# Patient Record
Sex: Female | Born: 1995 | Hispanic: Yes | Marital: Single | Smoking: Former smoker
Health system: Southern US, Community
[De-identification: ages and names within clinical notes are randomized; demographics above are authoritative.]

## PROBLEM LIST (undated history)

## (undated) ENCOUNTER — Inpatient Hospital Stay (HOSPITAL_COMMUNITY): Payer: Self-pay

## (undated) DIAGNOSIS — Z789 Other specified health status: Secondary | ICD-10-CM

## (undated) HISTORY — PX: NO PAST SURGERIES: SHX2092

---
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
## 2013-12-26 NOTE — L&D Delivery Note (Signed)
Delivery Note At 1:29 AM a viable female was delivered via  (Presentation: LOA). Body cord x 1.    Placenta status: delivered with assistance via Tomasa BlaseSchultz; to Pathology.  Cord pH: sent  Anesthesia: None  Episiotomy: None Lacerations: None Suture Repair: n/a Est. Blood Loss (mL): 100 ml  Mom to postpartum.  Baby to NICU.  JACKSON-MOORE,Anistyn Graddy A 10/07/2014, 1:41 AM

## 2014-01-30 ENCOUNTER — Encounter (HOSPITAL_COMMUNITY): Payer: Self-pay | Admitting: Emergency Medicine

## 2014-01-30 DIAGNOSIS — R1013 Epigastric pain: Secondary | ICD-10-CM | POA: Insufficient documentation

## 2014-01-30 DIAGNOSIS — E860 Dehydration: Secondary | ICD-10-CM | POA: Insufficient documentation

## 2014-01-30 DIAGNOSIS — Z3202 Encounter for pregnancy test, result negative: Secondary | ICD-10-CM | POA: Insufficient documentation

## 2014-01-30 DIAGNOSIS — R112 Nausea with vomiting, unspecified: Secondary | ICD-10-CM | POA: Insufficient documentation

## 2014-01-30 DIAGNOSIS — Z87891 Personal history of nicotine dependence: Secondary | ICD-10-CM | POA: Insufficient documentation

## 2014-01-30 LAB — URINALYSIS, ROUTINE W REFLEX MICROSCOPIC
GLUCOSE, UA: NEGATIVE mg/dL
Hgb urine dipstick: NEGATIVE
LEUKOCYTES UA: NEGATIVE
NITRITE: NEGATIVE
PH: 5.5 (ref 5.0–8.0)
PROTEIN: 30 mg/dL — AB
Specific Gravity, Urine: 1.035 — ABNORMAL HIGH (ref 1.005–1.030)
Urobilinogen, UA: 1 mg/dL (ref 0.0–1.0)

## 2014-01-30 LAB — CBC WITH DIFFERENTIAL/PLATELET
BASOS PCT: 0 % (ref 0–1)
Basophils Absolute: 0 10*3/uL (ref 0.0–0.1)
EOS ABS: 0 10*3/uL (ref 0.0–0.7)
EOS PCT: 0 % (ref 0–5)
HEMATOCRIT: 40.1 % (ref 36.0–46.0)
HEMOGLOBIN: 14.3 g/dL (ref 12.0–15.0)
LYMPHS ABS: 0.9 10*3/uL (ref 0.7–4.0)
Lymphocytes Relative: 8 % — ABNORMAL LOW (ref 12–46)
MCH: 33.1 pg (ref 26.0–34.0)
MCHC: 35.7 g/dL (ref 30.0–36.0)
MCV: 92.8 fL (ref 78.0–100.0)
MONO ABS: 0.8 10*3/uL (ref 0.1–1.0)
MONOS PCT: 8 % (ref 3–12)
Neutro Abs: 9 10*3/uL — ABNORMAL HIGH (ref 1.7–7.7)
Neutrophils Relative %: 84 % — ABNORMAL HIGH (ref 43–77)
Platelets: 329 10*3/uL (ref 150–400)
RBC: 4.32 MIL/uL (ref 3.87–5.11)
RDW: 12 % (ref 11.5–15.5)
WBC: 10.7 10*3/uL — ABNORMAL HIGH (ref 4.0–10.5)

## 2014-01-30 LAB — URINE MICROSCOPIC-ADD ON

## 2014-01-30 LAB — POCT PREGNANCY, URINE: PREG TEST UR: NEGATIVE

## 2014-01-30 MED ORDER — ONDANSETRON 4 MG PO TBDP
8.0000 mg | ORAL_TABLET | Freq: Once | ORAL | Status: AC
  Administered 2014-01-30: 8 mg via ORAL
  Filled 2014-01-30: qty 2

## 2014-01-30 NOTE — ED Notes (Signed)
Pt reports nausea and vomiting since last night.  Pt states last vomited 30 minutes prior

## 2014-01-31 ENCOUNTER — Emergency Department (HOSPITAL_COMMUNITY)
Admission: EM | Admit: 2014-01-31 | Discharge: 2014-01-31 | Disposition: A | Discharge status: Home / Self Care | Payer: Medicaid Other | Attending: Emergency Medicine | Admitting: Emergency Medicine

## 2014-01-31 DIAGNOSIS — R112 Nausea with vomiting, unspecified: Secondary | ICD-10-CM

## 2014-01-31 DIAGNOSIS — E860 Dehydration: Secondary | ICD-10-CM

## 2014-01-31 LAB — COMPREHENSIVE METABOLIC PANEL
ALBUMIN: 4.4 g/dL (ref 3.5–5.2)
ALT: 10 U/L (ref 0–35)
AST: 16 U/L (ref 0–37)
Alkaline Phosphatase: 88 U/L (ref 39–117)
BUN: 14 mg/dL (ref 6–23)
CO2: 19 mEq/L (ref 19–32)
CREATININE: 0.7 mg/dL (ref 0.50–1.10)
Calcium: 9.3 mg/dL (ref 8.4–10.5)
Chloride: 101 mEq/L (ref 96–112)
GFR calc non Af Amer: >90 mL/min (ref >90)
GLUCOSE: 113 mg/dL — AB (ref 70–99)
Potassium: 4 mEq/L (ref 3.7–5.3)
Sodium: 138 mEq/L (ref 137–147)
TOTAL PROTEIN: 7.6 g/dL (ref 6.0–8.3)
Total Bilirubin: 0.9 mg/dL (ref 0.3–1.2)

## 2014-01-31 MED ORDER — SODIUM CHLORIDE 0.9 % IV SOLN
1000.0000 mL | Freq: Once | INTRAVENOUS | Status: AC
  Administered 2014-01-31: 1000 mL via INTRAVENOUS

## 2014-01-31 MED ORDER — PANTOPRAZOLE SODIUM 40 MG IV SOLR
40.0000 mg | Freq: Once | INTRAVENOUS | Status: AC
  Administered 2014-01-31: 40 mg via INTRAVENOUS
  Filled 2014-01-31: qty 40

## 2014-01-31 MED ORDER — SODIUM CHLORIDE 0.9 % IV SOLN: 1000.0000 mL | INTRAVENOUS | Status: DC

## 2014-01-31 MED ORDER — ONDANSETRON 8 MG PO TBDP: 8.0000 mg | ORAL_TABLET | Freq: Three times a day (TID) | ORAL | Status: DC | PRN

## 2014-01-31 NOTE — ED Notes (Signed)
Ate crackers, denies nausea.  Ready to go home.

## 2014-01-31 NOTE — ED Notes (Signed)
1.5 liters NS infusion completed.  Pt given crackers and ginger ale.

## 2014-01-31 NOTE — Discharge Instructions (Signed)
Nausea and Vomiting Nausea is a sick feeling that often comes before throwing up (vomiting). Vomiting is a reflex where stomach contents come out of your mouth. Vomiting can cause severe loss of body fluids (dehydration). Children and elderly adults can become dehydrated quickly, especially if they also have diarrhea. Nausea and vomiting are symptoms of a condition or disease. It is important to find the cause of your symptoms. CAUSES   Direct irritation of the stomach lining. This irritation can result from increased acid production (gastroesophageal reflux disease), infection, food poisoning, taking certain medicines (such as nonsteroidal anti-inflammatory drugs), alcohol use, or tobacco use.  Signals from the brain.These signals could be caused by a headache, heat exposure, an inner ear disturbance, increased pressure in the brain from injury, infection, a tumor, or a concussion, pain, emotional stimulus, or metabolic problems.  An obstruction in the gastrointestinal tract (bowel obstruction).  Illnesses such as diabetes, hepatitis, gallbladder problems, appendicitis, kidney problems, cancer, sepsis, atypical symptoms of a heart attack, or eating disorders.  Medical treatments such as chemotherapy and radiation.  Receiving medicine that makes you sleep (general anesthetic) during surgery. DIAGNOSIS Your caregiver may ask for tests to be done if the problems do not improve after a few days. Tests may also be done if symptoms are severe or if the reason for the nausea and vomiting is not clear. Tests may include:  Urine tests.  Blood tests.  Stool tests.  Cultures (to look for evidence of infection).  X-rays or other imaging studies. Test results can help your caregiver make decisions about treatment or the need for additional tests. TREATMENT You need to stay well hydrated. Drink frequently but in small amounts.You may wish to drink water, sports drinks, clear broth, or eat frozen  ice pops or gelatin dessert to help stay hydrated.When you eat, eating slowly may help prevent nausea.There are also some antinausea medicines that may help prevent nausea. HOME CARE INSTRUCTIONS   Take all medicine as directed by your caregiver.  If you do not have an appetite, do not force yourself to eat. However, you must continue to drink fluids.  If you have an appetite, eat a normal diet unless your caregiver tells you differently.  Eat a variety of complex carbohydrates (rice, wheat, potatoes, bread), lean meats, yogurt, fruits, and vegetables.  Avoid high-fat foods because they are more difficult to digest.  Drink enough water and fluids to keep your urine clear or pale yellow.  If you are dehydrated, ask your caregiver for specific rehydration instructions. Signs of dehydration may include:  Severe thirst.  Dry lips and mouth.  Dizziness.  Dark urine.  Decreasing urine frequency and amount.  Confusion.  Rapid breathing or pulse. SEEK IMMEDIATE MEDICAL CARE IF:   You have blood or brown flecks (like coffee grounds) in your vomit.  You have black or bloody stools.  You have a severe headache or stiff neck.  You are confused.  You have severe abdominal pain.  You have chest pain or trouble breathing.  You do not urinate at least once every 8 hours.  You develop cold or clammy skin.  You continue to vomit for longer than 24 to 48 hours.  You have a fever. MAKE SURE YOU:   Understand these instructions.  Will watch your condition.  Will get help right away if you are not doing well or get worse. Document Released: 12/12/2005 Document Revised: 03/05/2012 Document Reviewed: 05/11/2011 [facility] Patient Information 2014 [place], [place].  If you were a  [facility] patient or do not have insurance, here are some clinics in our community that may be able to provide care for free or on sliding payment scales.  [facility], [address], [place], [phone]; Monday to Friday, 9 a.m. - 7 p.m.; Saturday 9 a.m. to 1 p.m.   [facility], [address] [place]; [phone]; or [address]  [place]; [phone].    [facility] [place], [place]. [address] [place]; [phone]; Monday to Wednesday, 8:30 a.m. - 5 p.m.; Thursday, 8:30 a.m. - 8 p.m.   [facility], [address], [place]; [phone]; Monday to Friday, 8 a.m. - 4:30 p.m.    [facility], [place] [address] [place], [phone]; first and third Saturday of the  month, 9:30 a.m. - 12:30 p.m.   [facility], [address] [place], [phone]; second Saturday of the month, 9 a.m. -noon.   RESOURCE GUIDE  Dental Problems  Patients with Medicaid: [facility] [phone] [address]                                                                   [phone] [address] [employer] Phone:  [phone]                                                                             Phone:  [phone]  If unable to pay or uninsured, contact:  [facility] or [facility]. to become qualified for the adult dental clinic.  Chronic Pain Problems Contact [facility]  [phone] Patients need to be referred by their primary care doctor.  Insufficient Money for Medicine Contact [employer]:  call "211" or [facility] [phone].  No Primary Care Doctor Call [facility]  [phone] Other agencies that provide inexpensive medical care    [facility]  [phone]    [facility]  [phone]    [facility]  [phone]    [facility]  [phone]    [facility]  [phone]  Psychological Services [facility]  [phone] [facility]  [phone] [facility]   [phone] (emergency services  [phone])  Abuse/Neglect [facility] Child Abuse Hotline (531)712-8728 [facility] Child Abuse Hotline 726-640-1037 (After Hours)  Emergency Shelter [facility] (769)619-7620  [facility] [place] [facility] [place] 807-688-4270 [facility] [phone]  MRSA Hotline #:   (262) 350-0831  [facility]  [facility] [address] [place]                        [address]          [address]  [place]                                                [place]                            [place] Phone:  [phone]                                     Phone:  [phone]                   Phone:  [phone]  [facility] Phone:  [phone]  [facility] Child Abuse Hotline (570) 290-0478(336) 252-459-1383 [phone]) [phone](336) [phone] (After Hours)   Community Resources: *IF YOU ARE IN IMMEDIATE DANGER CALL 911!  Abuse/Neglect:  Family Services Crisis Hotline [facility]([place]): [phone](336) [phone] [facility] Against [facility]([place]): [phone]  After hours, holidays and weekends: [phone] National Domestic Violence Hotline: 714-717-31778384076961  Mental Health: [facility]: [address]: [phone](336) [phone]  Health Clinics:  [facility]): [phone](336) [phone] Monday - Friday 8 AM - 9 PM, Saturday and Sunday 10 AM - 9 PM   [facility]  [address]: [phone] Monday- Friday 8:30 AM - 5:30 PM, Sat 9 AM - 1 PM  24 [facility] [place] [facility] [address]: [phone] [facility]: [phone] [facility] [address]: [phone]  24 HR [facility]: [address] [phone]

## 2014-01-31 NOTE — ED Provider Notes (Signed)
CSN: [id number]     Arrival date & time 01/30/14  2300 History   First MD Initiated Contact with Patient 01/31/14 0057     Chief Complaint  Patient presents with  . Emesis   (Consider location/radiation/quality/duration/timing/severity/associated sxs/prior Treatment) HPI 18 year old female presents to emergency department with complaint of nausea and vomiting.  Vomiting starting Wednesday night.  Her  daughter has the same illness.  No fevers no chills.  No diarrhea.  She has mild upper abdominal pain.  She's been unable to down food or liquids.  No other symptoms Past Medical History  Diagnosis Date  . No pertinent past medical history    Past Surgical History  Procedure Laterality Date  . No past surgeries     No family history on file. History  Substance Use Topics  . Smoking status: Former Smoker    Types: Cigarettes    Quit date: 02/01/2012  . Smokeless tobacco: Not on file  . Alcohol Use: No     Comment: on weekends   OB History   Grav Para Term Preterm Abortions TAB SAB Ect Mult Living   1 1 0 1 0 0 0 0 0 1      Review of Systems  See History of Present Illness; otherwise all other systems are reviewed and negative Allergies  Review of patient's allergies indicates no known allergies.  Home Medications  No current outpatient prescriptions on file. BP 126/68  Pulse 109  Temp(Src) 98.3 F (36.8 C) (Oral)  Resp 18  Wt 98 lb 4.8 oz (44.589 kg)  SpO2 97% Physical Exam  Nursing note and vitals reviewed. Constitutional: She is oriented to person, place, and time. She appears well-developed and well-nourished.  HENT:  Head: Normocephalic and atraumatic.  Right Ear: External ear normal.  Left Ear: External ear normal.  Nose: Nose normal.  Dry mucous membranes  Eyes: Conjunctivae and EOM are normal. Pupils are equal, round, and reactive to light.  Neck: Normal range of motion. Neck supple. No JVD present. No tracheal deviation present. No thyromegaly present.   Cardiovascular: Normal rate, regular rhythm, normal heart sounds and intact distal pulses.  Exam reveals no gallop and no friction rub.   No murmur heard. Pulmonary/Chest: Effort normal and breath sounds normal. No stridor. No respiratory distress. She has no wheezes. She has no rales. She exhibits no tenderness.  Abdominal: Soft. Bowel sounds are normal. She exhibits no distension and no mass. There is tenderness (mild to moderate epigastric tenderness with palpation.  No Murphy's sign, there no right upper quadrant pain). There is no rebound and no guarding.  Musculoskeletal: Normal range of motion. She exhibits no edema and no tenderness.  Lymphadenopathy:    She has no cervical adenopathy.  Neurological: She is alert and oriented to person, place, and time. She exhibits normal muscle tone. Coordination normal.  Skin: Skin is warm and dry. No rash noted. No erythema. No pallor.  Psychiatric: She has a normal mood and affect. Her behavior is normal. Judgment and thought content normal.    ED Course  Procedures (including critical care time) Labs Review Labs Reviewed  CBC WITH DIFFERENTIAL - Abnormal; Notable for the following:    WBC 10.7 (*)    Neutrophils Relative % 84 (*)    Neutro Abs 9.0 (*)    Lymphocytes Relative 8 (*)    All other components within normal limits  COMPREHENSIVE METABOLIC PANEL - Abnormal; Notable for the following:    Glucose, Bld 113 (*)  All other components within normal limits  URINALYSIS, ROUTINE W REFLEX MICROSCOPIC - Abnormal; Notable for the following:    APPearance CLOUDY (*)    Specific Gravity, Urine 1.035 (*)    Bilirubin Urine SMALL (*)    Ketones, ur >80 (*)    Protein, ur 30 (*)    All other components within normal limits  URINE MICROSCOPIC-ADD ON - Abnormal; Notable for the following:    Squamous Epithelial / LPF MANY (*)    Bacteria, UA FEW (*)    All other components within normal limits  POCT PREGNANCY, URINE   Imaging Review No  results found.  EKG Interpretation   None       MDM   1. Nausea and vomiting   2. Dehydration    18 year old female with nausea and vomiting.  Her labs show ketones in the urine, otherwise, unremarkable.  Plan to give IV fluids, Zofran, by mouth challenge and then home.    Lindsay Bauer M Lindsay Harn, MD 01/31/14 [phone]

## 2014-04-15 ENCOUNTER — Inpatient Hospital Stay (HOSPITAL_COMMUNITY)
Admission: AD | Admit: 2014-04-15 | Discharge: 2014-04-15 | Disposition: A | Discharge status: Home / Self Care | Payer: Medicaid Other | Source: Ambulatory Visit | Attending: Obstetrics & Gynecology | Admitting: Obstetrics & Gynecology

## 2014-04-15 ENCOUNTER — Encounter (HOSPITAL_COMMUNITY): Payer: Self-pay

## 2014-04-15 DIAGNOSIS — Z3201 Encounter for pregnancy test, result positive: Secondary | ICD-10-CM | POA: Insufficient documentation

## 2014-04-15 LAB — URINALYSIS, ROUTINE W REFLEX MICROSCOPIC
BILIRUBIN URINE: NEGATIVE
Glucose, UA: NEGATIVE mg/dL
HGB URINE DIPSTICK: NEGATIVE
KETONES UR: NEGATIVE mg/dL
NITRITE: NEGATIVE
PH: 6 (ref 5.0–8.0)
Protein, ur: NEGATIVE mg/dL
SPECIFIC GRAVITY, URINE: 1.025 (ref 1.005–1.030)
Urobilinogen, UA: 0.2 mg/dL (ref 0.0–1.0)

## 2014-04-15 LAB — URINE MICROSCOPIC-ADD ON

## 2014-04-15 LAB — POCT PREGNANCY, URINE: Preg Test, Ur: POSITIVE — AB

## 2014-04-15 NOTE — Discharge Instructions (Signed)
Prenatal Care  °WHAT IS PRENATAL CARE?  °Prenatal care means health care during your pregnancy, before your baby is born. It is very important to take care of yourself and your baby during your pregnancy by:  °· Getting early prenatal care. If you know you are pregnant, or think you might be pregnant, call your health care provider as soon as possible. Schedule a visit for a prenatal exam. °· Getting regular prenatal care. Follow your health care provider's schedule for blood and other necessary tests. Do not miss appointments. °· Doing everything you can to keep yourself and your baby healthy during your pregnancy. °· Getting complete care. Prenatal care should include evaluation of the medical, dietary, educational, psychological, and social needs of you and your significant other. The medical and genetic history of your family and the family of your baby's father should be discussed with your health care provider. °· Discussing with your health care provider: °· Prescription, over-the-counter, and herbal medicines that you take. °· Any history of substance abuse, alcohol use, smoking, and illegal drug use. °· Any history of domestic abuse and violence. °· Immunizations you have received. °· Your nutrition and diet. °· The amount of exercise you do. °· Any environmental and occupational hazards to which you are exposed. °· History of sexually transmitted infections for both you and your partner. °· Previous pregnancies you have had. °WHY IS PRENATAL CARE SO IMPORTANT?  °By regularly seeing your health care provider, you help ensure that problems can be identified early so that they can be treated as soon as possible. Other problems might be prevented. Many studies have shown that early and regular prenatal care is important for the health of mothers and their babies.  °HOW CAN I TAKE CARE OF MYSELF WHILE I AM PREGNANT?  °Here are ways to take care of yourself and your baby:  °· Start or continue taking your  multivitamin with 400 micrograms (mcg) of folic acid every day. °· Get early and regular prenatal care. It is very important to see a health care provider during your pregnancy. Your health care provider will check at each visit to make sure that you and the baby are healthy. If there are any problems, action can be taken right away to help you and the baby. °· Eat a healthy diet that includes: °· Fruits. °· Vegetables. °· Foods low in saturated fat. °· Whole grains. °· Calcium-rich foods, such as milk, yogurt, and hard cheeses. °· Drink 6 to 8 glasses of liquids a day. °· Unless your health care provider tells you not to, try to be physically active for 30 minutes, most days of the week. If you are pressed for time, you can get your activity in through 10-minute segments, three times a day. °· Do not smoke, drink alcohol, or use drugs. These can cause long-term damage to your baby. Talk with your health care provider about steps to take to stop smoking. Talk with a member of your faith community, a counselor, a trusted friend, or your health care provider if you are concerned about your alcohol or drug use. °· Ask your health care provider before taking any medicine, even over-the-counter medicines. Some medicines are not safe to take during pregnancy. °· Get plenty of rest and sleep. °· Avoid hot tubs and saunas during pregnancy. °· Do not have X-rays taken unless absolutely necessary and with the recommendation of your health care provider. A lead shield can be placed on your abdomen to protect the   baby when X-rays are taken in other parts of the body. °· Do not empty the cat litter when you are pregnant. It may contain a parasite that causes an infection called toxoplasmosis, which can cause birth defects. Also, use gloves when working in garden areas used by cats. °· Do not eat uncooked or undercooked meats or fish. °· Do not eat soft, mold-ripened cheeses (Brie, Camembert, and chevre) or soft, blue-veined  cheese (Danish blue and Roquefort). °· Stay away from toxic chemicals like: °· Insecticides. °· Solvents (some cleaners or paint thinners). °· Lead. °· Mercury. °· Sexual intercourse may continue until the end of the pregnancy, unless you have a medical problem or there is a problem with the pregnancy and your health care provider tells you not to. °· Do not wear high-heel shoes, especially during the second half of the pregnancy. You can lose your balance and fall. °· Do not take long trips, unless absolutely necessary. Be sure to see your health care provider before going on the trip. °· Do not sit in one position for more than 2 hours when on a trip. °· Take a copy of your medical records when going on a trip. Know where a hospital is located in the city you are visiting, in case of an emergency. °· Most dangerous household products will have pregnancy warnings on their labels. Ask your health care provider about products if you are unsure. °· Limit or eliminate your caffeine intake from coffee, tea, sodas, medicines, and chocolate. °· Many women continue working through pregnancy. Staying active might help you stay healthier. If you have a question about the safety or the hours you work at your particular job, talk with your health care provider. °· Get informed: °· Read books. °· Watch videos. °· Go to childbirth classes for you and your significant other. °· Talk with experienced moms. °· Ask your health care provider about childbirth education classes for you and your partner. Classes can help you and your partner prepare for the birth of your baby. °· Ask about a baby doctor (pediatrician) and methods and pain medicine for labor, delivery, and possible cesarean delivery. °HOW OFTEN SHOULD I SEE MY HEALTH CARE PROVIDER DURING PREGNANCY?  °Your health care provider will give you a schedule for your prenatal visits. You will have visits more often as you get closer to the end of your pregnancy. An average  pregnancy lasts about 40 weeks.  °A typical schedule includes visiting your health care provider:  °· About once each month during your first 6 months of pregnancy. °· Every 2 weeks during the next 2 months. °· Weekly in the last month, until the delivery date. °Your health care provider will probably want to see you more often if: °· You are older than 35 years. °· Your pregnancy is high risk because you have certain health problems or problems with the pregnancy, such as: °· Diabetes. °· High blood pressure. °· The baby is not growing on schedule, according to the dates of the pregnancy. °Your health care provider will do special tests to make sure you and the baby are not having any serious problems. °WHAT HAPPENS DURING PRENATAL VISITS?  °· At your first prenatal visit, your health care provider will do a physical exam and talk to you about your health history and the health history of your partner and your family. Your health care provider will be able to tell you what date to expect your baby to be born on. °·   Your first physical exam will include checks of your blood pressure, measurements of your height and weight, and an exam of your pelvic organs. Your health care provider will do a Pap test if you have not had one recently and will do cultures of your cervix to make sure there is no infection. °· At each prenatal visit, there will be tests of your blood, urine, blood pressure, weight, and checking the progress of the baby. °· At your later prenatal visits, your health care provider will check how you are doing and how the baby is developing. You may have a number of tests done as your pregnancy progresses. °· Ultrasound exams are often used to check on the baby's growth and health. °· You may have more urine and blood tests, as well as special tests, if needed. These may include amniocentesis to examine fluid in the pregnancy sac, stress tests to check how the baby responds to contractions, or a  biophysical profile to measure fetus well-being. Your health care provider will explain the tests and why they are necessary. °· You should discuss with your health care provider your plans to breastfeed or bottle-feed your baby. °· Each visit is also a chance for you to learn about staying healthy during pregnancy and to ask questions. °Document Released: 12/15/2003 Document Revised: 10/02/2013 Document Reviewed: 05/30/2013 °[employer]® Patient Information ©2014 [employer]. ° °

## 2014-04-15 NOTE — MAU Note (Signed)
Patient states she has had 2 positive home pregnancy tests and needs confirmation. Denies any pain, bleeding or discharge. Has vomiting occasionally in the morning.

## 2014-04-15 NOTE — MAU Provider Note (Signed)
HPI:  [name] is a 18 y.o. female G2P0101 at 5068w4d who presents for a pregnancy verification letter. The patient denies bleeding or pain at this time. Denies urinary symptoms.    Objective:  GENERAL: Well-developed, well-nourished female in no acute distress.  HEENT: Normocephalic, atraumatic.   LUNGS: Effort normal HEART: Regular rate  SKIN: Warm, dry and without erythema PSYCH: Normal mood and affect    Filed Vitals:   04/15/14 1327  BP: 127/63  Pulse: 80  Temp: 98.9 F (37.2 C)  Resp: 16   Results for orders placed during the hospital encounter of 04/15/14 (from the past 48 hour(s))  URINALYSIS, ROUTINE W REFLEX MICROSCOPIC     Status: Abnormal   Collection Time    04/15/14  1:30 PM      Result Value Ref Range   Color, Urine YELLOW  YELLOW   APPearance CLOUDY (*) CLEAR   Specific Gravity, Urine 1.025  1.005 - 1.030   pH 6.0  5.0 - 8.0   Glucose, UA NEGATIVE  NEGATIVE mg/dL   Hgb urine dipstick NEGATIVE  NEGATIVE   Bilirubin Urine NEGATIVE  NEGATIVE   Ketones, ur NEGATIVE  NEGATIVE mg/dL   Protein, ur NEGATIVE  NEGATIVE mg/dL   Urobilinogen, UA 0.2  0.0 - 1.0 mg/dL   Nitrite NEGATIVE  NEGATIVE   Leukocytes, UA MODERATE (*) NEGATIVE  URINE MICROSCOPIC-ADD ON     Status: Abnormal   Collection Time    04/15/14  1:30 PM      Result Value Ref Range   Squamous Epithelial / LPF MANY (*) RARE   WBC, UA 11-20  <3 WBC/hpf   Urine-Other AMORPHOUS URATES/PHOSPHATES    POCT PREGNANCY, URINE     Status: Abnormal   Collection Time    04/15/14  1:49 PM      Result Value Ref Range   Preg Test, Ur POSITIVE (*) NEGATIVE   Comment:            THE SENSITIVITY OF THIS     METHODOLOGY IS >24 mIU/mL    MDM Urine culture pending Urine pregnancy test positive  Assessment:  1. Pregnancy test positive    Plan:  Discharge home in stable condition Return to MAU as needed Return with any pain or bleeding Start prenatal care ASAP Prenatal vitamin daily  Iona HansenJennifer  Irene Eugenio Dollins, NP 04/15/2014 2:48 PM

## 2014-04-16 LAB — URINE CULTURE
CULTURE: NO GROWTH
Colony Count: NO GROWTH

## 2014-04-17 NOTE — MAU Provider Note (Signed)
Attestation of Attending Supervision of Advanced Practitioner (PA/CNM/NP): Evaluation and management procedures were performed by the Advanced Practitioner under my supervision and collaboration.  I have reviewed the Advanced Practitioner's note and chart, and I agree with the management and plan.  Bonnee Zertuche, MD, FACOG Attending Obstetrician & Gynecologist Faculty Practice, [facility]  

## 2014-06-19 ENCOUNTER — Encounter: Payer: Self-pay | Admitting: Obstetrics

## 2014-06-19 ENCOUNTER — Ambulatory Visit (INDEPENDENT_AMBULATORY_CARE_PROVIDER_SITE_OTHER): Payer: Medicaid Other | Admitting: Obstetrics

## 2014-06-19 VITALS — BP 112/73 | HR 73 | Temp 97.4°F | Wt 107.0 lb

## 2014-06-19 DIAGNOSIS — O0990 Supervision of high risk pregnancy, unspecified, unspecified trimester: Secondary | ICD-10-CM

## 2014-06-19 DIAGNOSIS — Z3481 Encounter for supervision of other normal pregnancy, first trimester: Secondary | ICD-10-CM

## 2014-06-19 DIAGNOSIS — Z3480 Encounter for supervision of other normal pregnancy, unspecified trimester: Secondary | ICD-10-CM

## 2014-06-19 LAB — OB RESULTS CONSOLE GC/CHLAMYDIA
CHLAMYDIA, DNA PROBE: NEGATIVE
Gonorrhea: NEGATIVE

## 2014-06-19 MED ORDER — CITRANATAL HARMONY 27-1-260 MG PO CAPS: 1.0000 | ORAL_CAPSULE | Freq: Every day | ORAL | Status: DC

## 2014-06-19 NOTE — Progress Notes (Signed)
  Subjective:    Lindsay ModyMelissa Bauer is a 18 y.o. female being seen today for her obstetrical visit. She is at 5460w6d gestation. Patient reports: no complaints.  Problem List Items Addressed This Visit   None    Visit Diagnoses   Unspecified high-risk pregnancy    -  Primary    Previous preterm delivery at 36 weeks.    Relevant Medications       Prenat-FeFmCb-DSS-FA-DHA w/o A (CITRANATAL HARMONY) 27-1-260 MG CAPS    Other Relevant Orders       US OB Comp + 14 Wk       AMB Referral to Maternal Fetal Medicine (MFM)    Encounter for supervision of other normal pregnancy in first trimester        Relevant Orders       POCT urinalysis dipstick       Obstetric panel       HIV antibody       Hemoglobinopathy evaluation       Varicella zoster antibody, IgG       Vit D  25 hydroxy (rtn osteoporosis monitoring)       Culture, OB Urine       WET PREP BY MOLECULAR PROBE       GC/Chlamydia Probe Amp       TSH      There are no active problems to display for this patient.   Objective:     BP 112/73  Pulse 73  Temp(Src) 97.4 F (36.3 C)  Wt 107 lb (48.535 kg)  LMP 03/14/2014 Uterine Size: Below umbilicus     Assessment:    Pregnancy @ 4660w6d  weeks Doing well    Plan:    Problem list reviewed and updated. Labs reviewed.  Follow up in 2 weeks. FIRST/CF mutation testing/NIPT/QUAD SCREEN/fragile X/Ashkenazi Jewish population testing/Spinal muscular atrophy discussed: requested. Role of ultrasound in pregnancy discussed; fetal survey: requested. Amniocentesis discussed: not indicated.

## 2014-06-20 LAB — VITAMIN D 25 HYDROXY (VIT D DEFICIENCY, FRACTURES): Vit D, 25-Hydroxy: 23 ng/mL — ABNORMAL LOW (ref 30–89)

## 2014-06-20 LAB — OBSTETRIC PANEL
Antibody Screen: NEGATIVE
Basophils Absolute: 0 10*3/uL (ref 0.0–0.1)
Basophils Relative: 0 % (ref 0–1)
EOS ABS: 0.1 10*3/uL (ref 0.0–0.7)
Eosinophils Relative: 1 % (ref 0–5)
HCT: 37.7 % (ref 36.0–46.0)
HEMOGLOBIN: 13 g/dL (ref 12.0–15.0)
Hepatitis B Surface Ag: NEGATIVE
LYMPHS ABS: 2 10*3/uL (ref 0.7–4.0)
Lymphocytes Relative: 19 % (ref 12–46)
MCH: 32.7 pg (ref 26.0–34.0)
MCHC: 34.5 g/dL (ref 30.0–36.0)
MCV: 95 fL (ref 78.0–100.0)
MONOS PCT: 6 % (ref 3–12)
Monocytes Absolute: 0.6 10*3/uL (ref 0.1–1.0)
Neutro Abs: 7.8 10*3/uL — ABNORMAL HIGH (ref 1.7–7.7)
Neutrophils Relative %: 74 % (ref 43–77)
PLATELETS: 326 10*3/uL (ref 150–400)
RBC: 3.97 MIL/uL (ref 3.87–5.11)
RDW: 13.2 % (ref 11.5–15.5)
RH TYPE: POSITIVE
Rubella: 3.28 Index — ABNORMAL HIGH (ref <0.90)
WBC: 10.5 10*3/uL (ref 4.0–10.5)

## 2014-06-20 LAB — CULTURE, OB URINE
COLONY COUNT: NO GROWTH
ORGANISM ID, BACTERIA: NO GROWTH

## 2014-06-20 LAB — WET PREP BY MOLECULAR PROBE
CANDIDA SPECIES: NEGATIVE
Gardnerella vaginalis: NEGATIVE
TRICHOMONAS VAG: NEGATIVE

## 2014-06-20 LAB — VARICELLA ZOSTER ANTIBODY, IGG: Varicella IgG: 1511 Index — ABNORMAL HIGH (ref <135.00)

## 2014-06-20 LAB — HIV ANTIBODY (ROUTINE TESTING W REFLEX): HIV: NONREACTIVE

## 2014-06-20 LAB — GC/CHLAMYDIA PROBE AMP
CT PROBE, AMP APTIMA: NEGATIVE
GC Probe RNA: NEGATIVE

## 2014-06-20 LAB — TSH: TSH: 2.474 u[IU]/mL (ref 0.350–4.500)

## 2014-06-23 LAB — POCT URINALYSIS DIPSTICK
Bilirubin, UA: NEGATIVE
Glucose, UA: NEGATIVE
Ketones, UA: NEGATIVE
NITRITE UA: NEGATIVE
PH UA: 7.5
Protein, UA: NEGATIVE
RBC UA: NEGATIVE
Spec Grav, UA: 1.01
UROBILINOGEN UA: NEGATIVE

## 2014-06-23 LAB — HEMOGLOBINOPATHY EVALUATION
HGB A: 97.4 % (ref 96.8–97.8)
Hemoglobin Other: 0 %
Hgb A2 Quant: 2.6 % (ref 2.2–3.2)
Hgb F Quant: 0 % (ref 0.0–2.0)
Hgb S Quant: 0 %

## 2014-07-03 ENCOUNTER — Ambulatory Visit (INDEPENDENT_AMBULATORY_CARE_PROVIDER_SITE_OTHER): Payer: Medicaid Other | Admitting: Obstetrics

## 2014-07-03 DIAGNOSIS — Z3480 Encounter for supervision of other normal pregnancy, unspecified trimester: Secondary | ICD-10-CM

## 2014-07-04 ENCOUNTER — Encounter: Payer: Self-pay | Admitting: Obstetrics

## 2014-07-04 NOTE — Progress Notes (Signed)
  Subjective:    Lindsay ModyMelissa Noga is a 18 y.o. female being seen today for her obstetrical visit. She is at 9013w0d gestation. Patient reports: no complaints.  Problem List Items Addressed This Visit   None     There are no active problems to display for this patient.   Objective:     LMP 03/14/2014 Uterine Size: Below umbilicus     Assessment:    Pregnancy @ 6013w0d  weeks Doing well    Plan:    Problem list reviewed and updated. Labs reviewed.  Follow up in 4 weeks. FIRST/CF mutation testing/NIPT/QUAD SCREEN/fragile X/Ashkenazi Jewish population testing/Spinal muscular atrophy discussed: requested. Role of ultrasound in pregnancy discussed; fetal survey: requested. Amniocentesis discussed: not indicated.

## 2014-07-14 ENCOUNTER — Ambulatory Visit (INDEPENDENT_AMBULATORY_CARE_PROVIDER_SITE_OTHER): Payer: Medicaid Other | Admitting: Obstetrics

## 2014-07-14 VITALS — BP 112/70 | HR 109 | Temp 98.1°F | Wt 112.0 lb

## 2014-07-14 DIAGNOSIS — Z1389 Encounter for screening for other disorder: Secondary | ICD-10-CM

## 2014-07-14 DIAGNOSIS — Z3482 Encounter for supervision of other normal pregnancy, second trimester: Secondary | ICD-10-CM

## 2014-07-14 DIAGNOSIS — Z3480 Encounter for supervision of other normal pregnancy, unspecified trimester: Secondary | ICD-10-CM

## 2014-07-14 DIAGNOSIS — O0990 Supervision of high risk pregnancy, unspecified, unspecified trimester: Secondary | ICD-10-CM

## 2014-07-14 LAB — POCT URINALYSIS DIPSTICK
Bilirubin, UA: NEGATIVE
GLUCOSE UA: NEGATIVE
Ketones, UA: NEGATIVE
NITRITE UA: NEGATIVE
Protein, UA: NEGATIVE
RBC UA: NEGATIVE
Spec Grav, UA: 1.02
Urobilinogen, UA: NEGATIVE
pH, UA: 6

## 2014-07-14 MED ORDER — CITRANATAL HARMONY 27-1-260 MG PO CAPS: 1.0000 | ORAL_CAPSULE | Freq: Every day | ORAL | Status: DC

## 2014-07-15 LAB — AFP, QUAD SCREEN
AFP: 87.1 [IU]/mL
CURR GEST AGE: 17.3 wks.days
HCG, Total: 29277 m[IU]/mL
INH: 270.9 pg/mL
Interpretation-AFP: NEGATIVE
MOM FOR INH: 1.58
MoM for AFP: 2.02
MoM for hCG: 1.49
Open Spina bifida: NEGATIVE
Tri 18 Scr Risk Est: NEGATIVE
UE3 MOM: 2.2
uE3 Value: 2.3 ng/mL

## 2014-07-17 ENCOUNTER — Encounter: Payer: Self-pay | Admitting: Obstetrics

## 2014-07-17 NOTE — Progress Notes (Signed)
  Subjective:    Lindsay Bauer is a 18 y.o. female being seen today for her obstetrical visit. She is at 622w6d gestation. Patient reports: no complaints.  Problem List Items Addressed This Visit   None    Visit Diagnoses   Encounter for supervision of other normal pregnancy in second trimester    -  Primary    Relevant Orders       AFP, Quad Screen (Completed)       POCT urinalysis dipstick (Completed)    Encounter for routine screening for malformation using ultrasonics        Unspecified high-risk pregnancy        Previous preterm delivery at 36 weeks.    Relevant Medications       Prenat-FeFmCb-DSS-FA-DHA w/o A (CITRANATAL HARMONY) 27-1-260 MG CAPS      There are no active problems to display for this patient.   Objective:     BP 112/70  Pulse 109  Temp(Src) 98.1 F (36.7 C)  Wt 112 lb (50.803 kg)  LMP 03/14/2014 Uterine Size: Below umbilicus     Assessment:    Pregnancy @ 272w6d  weeks Doing well    Plan:    Problem list reviewed and updated. Labs reviewed.  Follow up in 4 weeks. FIRST/CF mutation testing/NIPT/QUAD SCREEN/fragile X/Ashkenazi Jewish population testing/Spinal muscular atrophy discussed: requested. Role of ultrasound in pregnancy discussed; fetal survey: requested. Amniocentesis discussed: not indicated.

## 2014-08-07 ENCOUNTER — Ambulatory Visit (HOSPITAL_COMMUNITY)
Admission: RE | Admit: 2014-08-07 | Discharge: 2014-08-07 | Disposition: A | Discharge status: Home / Self Care | Payer: Medicaid Other | Source: Ambulatory Visit | Attending: Obstetrics | Admitting: Obstetrics

## 2014-08-07 DIAGNOSIS — O09899 Supervision of other high risk pregnancies, unspecified trimester: Secondary | ICD-10-CM | POA: Insufficient documentation

## 2014-08-07 DIAGNOSIS — O0990 Supervision of high risk pregnancy, unspecified, unspecified trimester: Secondary | ICD-10-CM

## 2014-08-07 DIAGNOSIS — Z3402 Encounter for supervision of normal first pregnancy, second trimester: Secondary | ICD-10-CM

## 2014-08-07 DIAGNOSIS — Z3689 Encounter for other specified antenatal screening: Secondary | ICD-10-CM | POA: Insufficient documentation

## 2014-08-07 DIAGNOSIS — Z1389 Encounter for screening for other disorder: Secondary | ICD-10-CM

## 2014-08-11 ENCOUNTER — Encounter: Payer: Self-pay | Admitting: Obstetrics

## 2014-08-11 ENCOUNTER — Ambulatory Visit (INDEPENDENT_AMBULATORY_CARE_PROVIDER_SITE_OTHER): Payer: Medicaid Other | Admitting: Obstetrics

## 2014-08-11 VITALS — BP 109/65 | HR 84 | Temp 98.3°F | Wt 116.0 lb

## 2014-08-11 DIAGNOSIS — Z3482 Encounter for supervision of other normal pregnancy, second trimester: Secondary | ICD-10-CM

## 2014-08-11 DIAGNOSIS — Z3480 Encounter for supervision of other normal pregnancy, unspecified trimester: Secondary | ICD-10-CM

## 2014-08-11 LAB — POCT URINALYSIS DIPSTICK
Glucose, UA: NEGATIVE
Ketones, UA: NEGATIVE
Leukocytes, UA: NEGATIVE
Nitrite, UA: NEGATIVE
PROTEIN UA: NEGATIVE
RBC UA: NEGATIVE
Spec Grav, UA: 1.015
pH, UA: 6

## 2014-08-11 NOTE — Progress Notes (Signed)
Subjective:    Lindsay Bauer is a 18 y.o. female being seen today for her obstetrical visit. She is at 4172w3d gestation. Patient reports: no complaints . Fetal movement: normal.  Problem List Items Addressed This Visit   None    Visit Diagnoses   Encounter for supervision of other normal pregnancy in second trimester    -  Primary    Relevant Orders       POCT urinalysis dipstick (Completed)      Patient Active Problem List   Diagnosis Date Noted  . Supervision of normal first teen pregnancy in second trimester 08/07/2014   Objective:    BP 109/65  Pulse 84  Temp(Src) 98.3 F (36.8 C)  Wt 116 lb (52.617 kg)  LMP 03/14/2014 FHT: 150 BPM  Uterine Size: size equals dates     Assessment:    Pregnancy @ 4272w3d    Plan:    OBGCT: discussed.  Labs, problem list reviewed and updated 2 hr GTT planned Follow up in 4 weeks.

## 2014-09-08 ENCOUNTER — Ambulatory Visit (INDEPENDENT_AMBULATORY_CARE_PROVIDER_SITE_OTHER): Payer: Medicaid Other | Admitting: Obstetrics

## 2014-09-08 ENCOUNTER — Other Ambulatory Visit: Payer: Medicaid Other

## 2014-09-08 VITALS — BP 105/65 | HR 77 | Temp 98.0°F | Wt 120.0 lb

## 2014-09-08 DIAGNOSIS — Z23 Encounter for immunization: Secondary | ICD-10-CM

## 2014-09-08 DIAGNOSIS — Z3482 Encounter for supervision of other normal pregnancy, second trimester: Secondary | ICD-10-CM

## 2014-09-08 DIAGNOSIS — O0990 Supervision of high risk pregnancy, unspecified, unspecified trimester: Secondary | ICD-10-CM

## 2014-09-08 DIAGNOSIS — Z3480 Encounter for supervision of other normal pregnancy, unspecified trimester: Secondary | ICD-10-CM

## 2014-09-08 LAB — POCT URINALYSIS DIPSTICK
Bilirubin, UA: NEGATIVE
Blood, UA: NEGATIVE
Glucose, UA: NEGATIVE
KETONES UA: NEGATIVE
Nitrite, UA: NEGATIVE
Protein, UA: NEGATIVE
SPEC GRAV UA: 1.01
Urobilinogen, UA: NEGATIVE
pH, UA: 7

## 2014-09-08 LAB — CBC
HCT: 35 % — ABNORMAL LOW (ref 36.0–46.0)
HEMOGLOBIN: 12.1 g/dL (ref 12.0–15.0)
MCH: 32.7 pg (ref 26.0–34.0)
MCHC: 34.6 g/dL (ref 30.0–36.0)
MCV: 94.6 fL (ref 78.0–100.0)
Platelets: 299 10*3/uL (ref 150–400)
RBC: 3.7 MIL/uL — AB (ref 3.87–5.11)
RDW: 12.7 % (ref 11.5–15.5)
WBC: 10.5 10*3/uL (ref 4.0–10.5)

## 2014-09-08 MED ORDER — CITRANATAL HARMONY 27-1-260 MG PO CAPS: 1.0000 | ORAL_CAPSULE | Freq: Every day | ORAL | Status: DC

## 2014-09-08 NOTE — Progress Notes (Signed)
Patient states she is having some pressure and pain when she is trying to get up from laying .

## 2014-09-09 LAB — GLUCOSE TOLERANCE, 2 HOURS W/ 1HR
GLUCOSE, FASTING: 60 mg/dL — AB (ref 70–99)
Glucose, 1 hour: 89 mg/dL (ref 70–170)
Glucose, 2 hour: 70 mg/dL (ref 70–139)

## 2014-09-09 LAB — RPR

## 2014-09-10 ENCOUNTER — Encounter: Payer: Self-pay | Admitting: Obstetrics

## 2014-09-10 LAB — HIV ANTIBODY (ROUTINE TESTING W REFLEX): HIV 1&2 Ab, 4th Generation: NONREACTIVE

## 2014-09-10 NOTE — Progress Notes (Signed)
Subjective:    Lindsay Bauer is a 18 y.o. female being seen today for her obstetrical visit. She is at [redacted]w[redacted]d gestation. Patient reports: no complaints . Fetal movement: normal.  Problem List Items Addressed This Visit   None    Visit Diagnoses   Unspecified high-risk pregnancy    -  Primary    Previous preterm delivery at 36 weeks.    Relevant Medications       Prenat-FeFmCb-DSS-FA-DHA w/o A (CITRANATAL HARMONY) 27-1-260 MG CAPS    Other Relevant Orders       Glucose Tolerance, 2 Hours w/1 Hour (Completed)       CBC (Completed)       HIV antibody (Completed)       RPR (Completed)       POCT urinalysis dipstick (Completed)    Encounter for supervision of other normal pregnancy in second trimester        Relevant Orders       Glucose Tolerance, 2 Hours w/1 Hour (Completed)       CBC (Completed)       HIV antibody (Completed)       RPR (Completed)       POCT urinalysis dipstick (Completed)       Flu Vaccine QUAD 36+ mos IM (Fluarix) (Completed)    Need for immunization against influenza        Relevant Orders       Flu Vaccine QUAD 36+ mos IM (Fluarix) (Completed)      Patient Active Problem List   Diagnosis Date Noted  . Supervision of normal first teen pregnancy in second trimester 08/07/2014   Objective:    BP 105/65  Pulse 77  Temp(Src) 98 F (36.7 C)  Wt 120 lb (54.432 kg)  LMP 03/14/2014 FHT: 150 BPM  Uterine Size: size equals dates     Assessment:    Pregnancy @ [redacted]w[redacted]d    Plan:    OBGCT: ordered.  Labs, problem list reviewed and updated 2 hr GTT planned Follow up in 2 weeks.

## 2014-09-24 ENCOUNTER — Encounter: Payer: Self-pay | Admitting: Obstetrics

## 2014-09-24 ENCOUNTER — Ambulatory Visit (INDEPENDENT_AMBULATORY_CARE_PROVIDER_SITE_OTHER): Payer: Medicaid Other | Admitting: Obstetrics

## 2014-09-24 VITALS — BP 111/73 | HR 85 | Temp 98.1°F | Wt 123.0 lb

## 2014-09-24 DIAGNOSIS — Z3480 Encounter for supervision of other normal pregnancy, unspecified trimester: Secondary | ICD-10-CM

## 2014-09-24 DIAGNOSIS — Z3482 Encounter for supervision of other normal pregnancy, second trimester: Secondary | ICD-10-CM

## 2014-09-24 DIAGNOSIS — Z3483 Encounter for supervision of other normal pregnancy, third trimester: Secondary | ICD-10-CM

## 2014-09-24 NOTE — Progress Notes (Signed)
Subjective:    Lindsay Bauer is a 18 y.o. female being seen today for her obstetrical visit. She is at 8134w5d gestation. Patient reports: no complaints . Fetal movement: normal.  Problem List Items Addressed This Visit   None    Visit Diagnoses   Encounter for supervision of other normal pregnancy in second trimester    -  Primary    Relevant Orders       POCT urinalysis dipstick      Patient Active Problem List   Diagnosis Date Noted  . Supervision of normal first teen pregnancy in second trimester 08/07/2014   Objective:    BP 111/73  Pulse 85  Temp(Src) 98.1 F (36.7 C)  Wt 123 lb (55.792 kg)  LMP 03/14/2014 FHT: 150 BPM  Uterine Size: size equals dates     Assessment:    Pregnancy @ 3834w5d    Plan:    OBGCT: discussed.  Labs, problem list reviewed and updated 2 hr GTT planned Follow up in 2 weeks.

## 2014-09-29 ENCOUNTER — Inpatient Hospital Stay (HOSPITAL_COMMUNITY): Payer: Medicaid Other

## 2014-09-29 ENCOUNTER — Encounter (HOSPITAL_COMMUNITY): Payer: Self-pay | Admitting: *Deleted

## 2014-09-29 ENCOUNTER — Inpatient Hospital Stay (HOSPITAL_COMMUNITY)
Admission: AD | Admit: 2014-09-29 | Discharge: 2014-10-09 | DRG: 775 | Disposition: A | Discharge status: Home / Self Care | Payer: Medicaid Other | Source: Ambulatory Visit | Attending: Obstetrics & Gynecology | Admitting: Obstetrics & Gynecology

## 2014-09-29 DIAGNOSIS — O4700 False labor before 37 completed weeks of gestation, unspecified trimester: Secondary | ICD-10-CM | POA: Diagnosis present

## 2014-09-29 DIAGNOSIS — O42919 Preterm premature rupture of membranes, unspecified as to length of time between rupture and onset of labor, unspecified trimester: Secondary | ICD-10-CM

## 2014-09-29 DIAGNOSIS — Z87891 Personal history of nicotine dependence: Secondary | ICD-10-CM

## 2014-09-29 DIAGNOSIS — Z3A30 30 weeks gestation of pregnancy: Secondary | ICD-10-CM | POA: Diagnosis present

## 2014-09-29 DIAGNOSIS — O479 False labor, unspecified: Secondary | ICD-10-CM | POA: Diagnosis present

## 2014-09-29 DIAGNOSIS — O42913 Preterm premature rupture of membranes, unspecified as to length of time between rupture and onset of labor, third trimester: Secondary | ICD-10-CM

## 2014-09-29 DIAGNOSIS — O4292 Full-term premature rupture of membranes, unspecified as to length of time between rupture and onset of labor: Secondary | ICD-10-CM

## 2014-09-29 LAB — URINALYSIS, ROUTINE W REFLEX MICROSCOPIC
Bilirubin Urine: NEGATIVE
Glucose, UA: NEGATIVE mg/dL
Hgb urine dipstick: NEGATIVE
Ketones, ur: NEGATIVE mg/dL
NITRITE: NEGATIVE
PROTEIN: NEGATIVE mg/dL
Specific Gravity, Urine: 1.02 (ref 1.005–1.030)
Urobilinogen, UA: 0.2 mg/dL (ref 0.0–1.0)
pH: 7 (ref 5.0–8.0)

## 2014-09-29 LAB — CBC
HCT: 33.2 % — ABNORMAL LOW (ref 36.0–46.0)
Hemoglobin: 11.5 g/dL — ABNORMAL LOW (ref 12.0–15.0)
MCH: 33.5 pg (ref 26.0–34.0)
MCHC: 34.6 g/dL (ref 30.0–36.0)
MCV: 96.8 fL (ref 78.0–100.0)
PLATELETS: 265 10*3/uL (ref 150–400)
RBC: 3.43 MIL/uL — ABNORMAL LOW (ref 3.87–5.11)
RDW: 12.6 % (ref 11.5–15.5)
WBC: 15.2 10*3/uL — ABNORMAL HIGH (ref 4.0–10.5)

## 2014-09-29 LAB — POCT FERN TEST: POCT Fern Test: POSITIVE

## 2014-09-29 LAB — URINE MICROSCOPIC-ADD ON

## 2014-09-29 LAB — RPR

## 2014-09-29 LAB — GROUP B STREP BY PCR: GROUP B STREP BY PCR: NEGATIVE

## 2014-09-29 LAB — TYPE AND SCREEN
ABO/RH(D): O POS
Antibody Screen: NEGATIVE

## 2014-09-29 LAB — OB RESULTS CONSOLE GBS: STREP GROUP B AG: NEGATIVE

## 2014-09-29 MED ORDER — MAGNESIUM SULFATE 40 G IN LACTATED RINGERS - SIMPLE
1.0000 g/h | INTRAVENOUS | Status: AC
  Administered 2014-09-29: 1 g/h via INTRAVENOUS

## 2014-09-29 MED ORDER — LACTATED RINGERS IV SOLN
INTRAVENOUS | Status: DC
  Administered 2014-09-29 – 2014-10-01 (×3): via INTRAVENOUS

## 2014-09-29 MED ORDER — AZITHROMYCIN 250 MG PO TABS
1000.0000 mg | ORAL_TABLET | Freq: Once | ORAL | Status: AC
  Administered 2014-09-29: 1000 mg via ORAL
  Filled 2014-09-29: qty 4

## 2014-09-29 MED ORDER — PRENATAL MULTIVITAMIN CH
1.0000 | ORAL_TABLET | Freq: Every day | ORAL | Status: DC
  Administered 2014-09-29 – 2014-10-06 (×8): 1 via ORAL
  Filled 2014-09-29 (×9): qty 1

## 2014-09-29 MED ORDER — ZOLPIDEM TARTRATE 5 MG PO TABS: 5.0000 mg | ORAL_TABLET | Freq: Every evening | ORAL | Status: DC | PRN

## 2014-09-29 MED ORDER — MAGNESIUM SULFATE BOLUS VIA INFUSION: 1.0000 g | INTRAVENOUS | Status: DC

## 2014-09-29 MED ORDER — DOCUSATE SODIUM 100 MG PO CAPS
100.0000 mg | ORAL_CAPSULE | Freq: Every day | ORAL | Status: DC
  Administered 2014-09-30 – 2014-10-06 (×7): 100 mg via ORAL
  Filled 2014-09-29 (×8): qty 1

## 2014-09-29 MED ORDER — LACTATED RINGERS IV SOLN
INTRAVENOUS | Status: DC
  Administered 2014-09-29: 12:00:00 via INTRAVENOUS

## 2014-09-29 MED ORDER — MAGNESIUM SULFATE 40 G IN LACTATED RINGERS - SIMPLE
1.0000 g/h | INTRAVENOUS | Status: DC
  Administered 2014-09-29: 1 g/h via INTRAVENOUS
  Filled 2014-09-29: qty 500

## 2014-09-29 MED ORDER — SODIUM CHLORIDE 0.9 % IV SOLN
2.0000 g | Freq: Four times a day (QID) | INTRAVENOUS | Status: DC
  Administered 2014-09-29 – 2014-10-02 (×12): 2 g via INTRAVENOUS
  Filled 2014-09-29 (×13): qty 2000

## 2014-09-29 MED ORDER — BETAMETHASONE SOD PHOS & ACET 6 (3-3) MG/ML IJ SUSP
12.0000 mg | Freq: Once | INTRAMUSCULAR | Status: AC
  Administered 2014-09-29: 12 mg via INTRAMUSCULAR
  Filled 2014-09-29: qty 2

## 2014-09-29 MED ORDER — ACETAMINOPHEN 325 MG PO TABS: 650.0000 mg | ORAL_TABLET | ORAL | Status: DC | PRN

## 2014-09-29 MED ORDER — MAGNESIUM SULFATE BOLUS VIA INFUSION
4.0000 g | Freq: Once | INTRAVENOUS | Status: AC
  Administered 2014-09-29: 4 g via INTRAVENOUS

## 2014-09-29 MED ORDER — CALCIUM CARBONATE ANTACID 500 MG PO CHEW
2.0000 | CHEWABLE_TABLET | ORAL | Status: DC | PRN
  Filled 2014-09-29: qty 2

## 2014-09-29 NOTE — MAU Note (Signed)
Patient presents to MAU with c/o leaking of clear fluid that has continued to trickle; States first felt a gush of fluid at 0930 this am. Not currently wearing a pad. Denies Vaginal bleeding or contractions at this time. +FM.

## 2014-09-29 NOTE — Progress Notes (Signed)
Ur chart review completed.  

## 2014-09-29 NOTE — H&P (Signed)
Lindsay Bauer is Bauer 18 y.o. female presenting with SROM. Maternal Medical History:  Reason for admission: Rupture of membranes.   Fetal activity: Perceived fetal activity is normal.    Prenatal complications: no prenatal complications Prenatal Complications - Diabetes: none.    OB History   Grav Para Term Preterm Abortions TAB SAB Ect Mult Living   2 1 0 1 0 0 0 0 0 1      Past Medical History  Diagnosis Date  . No pertinent past medical history    Past Surgical History  Procedure Laterality Date  . No past surgeries     Family History: family history is not on file. Social History:  reports that she quit smoking about 2 years ago. Her smoking use included Cigarettes. She smoked 0.00 packs per day. She has never used smokeless tobacco. She reports that she does not drink alcohol or use illicit drugs.     Review of Systems  Constitutional: Negative for fever.  Eyes: Negative for blurred vision.  Respiratory: Negative for shortness of breath.   Gastrointestinal: Negative for vomiting.  Skin: Negative for rash.  Neurological: Negative for headaches.    Dilation: 1 Effacement (%): Thick Station: Ballotable Exam by:: Lindsay CarbonJennifer Rasch NP Blood pressure 114/69, pulse 113, temperature 98 F (36.7 C), temperature source Oral, resp. rate 16, height 4' 11.5" (1.511 m), weight 56.79 kg (125 lb 3.2 oz), last menstrual period 03/14/2014, SpO2 100.00%. Maternal Exam:  Abdomen: not evaluated.  Introitus: Ferning test: positive.  Nitrazine test: positive. Amniotic fluid character: clear.  Cervix: Cervix evaluated by digital exam.     Fetal Exam Fetal Monitor Review: Baseline rate: 140.  Variability: moderate (6-25 bpm).   Pattern: no decelerations and accelerations present.    Fetal State Assessment: Category I - tracings are normal.     Physical Exam  Constitutional: She appears well-developed.  HENT:  Head: Normocephalic.  Neck: Neck supple. No thyromegaly present.   Cardiovascular: Normal rate and regular rhythm.   Respiratory: Breath sounds normal.  GI: Soft. Bowel sounds are normal.  Skin: No rash noted.    Prenatal labs: ABO, Rh: O/POS/-- (06/25 1315) Antibody: NEG (06/25 1315) Rubella: 3.28 (06/25 1315) RPR: NON REAC (09/14 1340)  HBsAg: NEGATIVE (06/25 1315)  HIV: NONREACTIVE (09/14 1340)  GBS:     Assessment/Plan: PPROM @ 6362w4d.  Not in labor.  Category I FHT.  Admit Antibiotics/steroids MgSO4 for neuroprotection x 24 hours    JACKSON-MOORE,Lindsay Bauer 09/29/2014, 1:08 PM

## 2014-09-29 NOTE — MAU Provider Note (Signed)
History     CSN: [id number]  Arrival date and time: 09/29/14 1044   First Provider Initiated Contact with Patient 09/29/14 1111      Chief Complaint  Patient presents with  . Rupture of Membranes   HPI  [name] is a 18 y.o. female G60P0101 at [redacted]w[redacted]d (2nd trimester [place] dating) who present with ?ROM. She felt a large gush of fluid this morning around 0930 and it has continued to leak throughout the morning. The fluid was clear; no odor. She does not have a history of preterm delivery; last baby was born at 78 weeks. +fetal movements, denies contractions. " the fluid has stopped leaking now".   OB history shows a preterm vaginal delivery at [redacted]w[redacted]d; patient is unsure.   Patient had a second trimester [place] for dating. EDD based on [place] 12/04/14 Per LMP EDD: 12/19/14   OB History   Grav Para Term Preterm Abortions TAB SAB Ect Mult Living   2 1 0 1 0 0 0 0 0 1       Past Medical History  Diagnosis Date  . No pertinent past medical history     Past Surgical History  Procedure Laterality Date  . No past surgeries      History reviewed. No pertinent family history.  History  Substance Use Topics  . Smoking status: Former Smoker    Types: Cigarettes    Quit date: 02/01/2012  . Smokeless tobacco: Never Used  . Alcohol Use: No     Comment: on weekends    Allergies: No Known Allergies  Prescriptions prior to admission  Medication Sig Dispense Refill  . ondansetron (ZOFRAN ODT) 8 MG disintegrating tablet Take 1 tablet (8 mg total) by mouth every 8 (eight) hours as needed for nausea or vomiting.  20 tablet  0  . Prenat-FeFmCb-DSS-FA-DHA w/o A (CITRANATAL HARMONY) 27-1-260 MG CAPS Take 1 capsule by mouth daily before breakfast.  90 capsule  3   Results for orders placed during the hospital encounter of 09/29/14 (from the past 48 hour(s))  GROUP B STREP BY PCR     Status: None   Collection Time    09/29/14 11:25 AM      Result Value Ref Range   Group B strep by PCR  NEGATIVE  NEGATIVE  POCT FERN TEST     Status: None   Collection Time    09/29/14 11:38 AM      Result Value Ref Range   POCT Fern Test Positive = ruptured amniotic membanes    CBC     Status: Abnormal   Collection Time    09/29/14 11:50 AM      Result Value Ref Range   WBC 15.2 (*) 4.0 - 10.5 K/uL   RBC 3.43 (*) 3.87 - 5.11 MIL/uL   Hemoglobin 11.5 (*) 12.0 - 15.0 g/dL   HCT 40.9 (*) 81.1 - 91.4 %   MCV 96.8  78.0 - 100.0 fL   MCH 33.5  26.0 - 34.0 pg   MCHC 34.6  30.0 - 36.0 g/dL   RDW 78.2  95.6 - 21.3 %   Platelets 265  150 - 400 K/uL    Review of Systems  Constitutional: Negative for fever and chills.  Gastrointestinal: Negative for abdominal pain.  Genitourinary: Negative for dysuria, urgency and frequency.  Musculoskeletal: Negative for back pain.   Physical Exam   Blood pressure 114/69, pulse 113, temperature 98 F (36.7 C), temperature source Oral, resp. rate 16, height 4'  11.5" (1.511 m), weight 56.79 kg (125 lb 3.2 oz), last menstrual period 03/14/2014, SpO2 100.00%.  Physical Exam  Constitutional: She is oriented to person, place, and time. She appears well-developed and well-nourished. No distress.  HENT:  Head: Normocephalic.  Eyes: Pupils are equal, round, and reactive to light.  Neck: Neck supple.  Respiratory: Effort normal.  GI: Soft.  Genitourinary:  Speculum exam: Vagina - Large amount of clear fluid pooling in the vagina. No odor Fern slide collected  Chaperone present for exam.   Neurological: She is alert and oriented to person, place, and time.  Skin: Skin is warm. She is not diaphoretic.  Psychiatric: Her behavior is normal.      Dilation: 1 Effacement (%): Thick Station: Ballotable Exam by:: Lindsay CarbonJennifer Anaiza Behrens NP  MAU Course  Procedures None   MDM Fern positive  GBS collected  Notified Dr. Tamela OddiJackson-Moore of results. Admit orders placed  Assessment and Plan   A: PPROM   P: GBS pending Betamethasone dose #1 given  Admit to  [facility] BoozerAnte   Zyhir Cappella Irene Quantavia Frith, NP 09/29/2014 2:33 PM

## 2014-09-29 NOTE — Consult Note (Signed)
Asked by Dr.Jackson-Moore to provide prenatal consultation for patient at risk for preterm delivery due to PROM.  Mother is 18 y.o. G2 P1 who is now 7930 4/[redacted] weeks EGA, Hx of preterm delivery with previous pregnancy. Had SROM (clear) this morning and has had occasional UC's but not in active labor, no fever or Sx of chorio.  Has been given BMZ x 1 and started on ampicillin and azithromycin.   Discussed usual expectations for preterm infant at 30+ weeks gestation, including possible needs for DR resuscitation, respiratory support, and IV access. Presented optimistic long-term outcome for most cases.  Discussed advantages of feeding with mother's milk.  She plans to pump postnatally.  Patient seemed distracted, had no questions, but was appreciative of my input.  Thank you for the consultation.  Total time 20 minutes

## 2014-09-30 LAB — URINE CULTURE
Colony Count: NO GROWTH
Culture: NO GROWTH

## 2014-09-30 NOTE — Progress Notes (Signed)
Patient ID: Kathlen ModyMelissa Pasquarella, female   DOB: 08/21/1996, 18 y.o.   MRN: [id number] Hospital Day: 2  S: Preterm labor symptoms: fluid leakage and occasional mild UC.  O: Blood pressure 109/47, pulse 95, temperature 98 F (36.7 C), temperature source Oral, resp. rate 16, height 4' 11.5" (1.511 m), weight 125 lb 3.2 oz (56.79 kg), last menstrual period 03/14/2014, SpO2 99.00%.   XBM:WUXLKGMWFHT:Baseline: 150 bpm Toco: None NUU:VOZDGUYQSVE:Dilation: 1 Effacement (%): Thick Station: Ballotable Exam by:: Venia CarbonJennifer Rasch NP  A/P- 18 y.o. admitted with:  PPROM at 30 weeks.  Not in labor.  Stable.  Continue bedrest.  Present on Admission:  . Indication for care in labor and delivery, antepartum  Pregnancy Complications: PPROM  Preterm labor management: IV D5LR started, bedrest advised and pelvic rest advised Dating:  5628w5d PNL Needed:  None FWB:  good PTL:  stable

## 2014-10-01 ENCOUNTER — Other Ambulatory Visit (HOSPITAL_COMMUNITY): Payer: Medicaid Other

## 2014-10-01 ENCOUNTER — Inpatient Hospital Stay (HOSPITAL_COMMUNITY): Payer: Medicaid Other

## 2014-10-01 MED ORDER — SODIUM CHLORIDE 0.9 % IJ SOLN
3.0000 mL | Freq: Two times a day (BID) | INTRAMUSCULAR | Status: DC
  Administered 2014-10-01 – 2014-10-06 (×12): 3 mL via INTRAVENOUS

## 2014-10-01 NOTE — Progress Notes (Signed)
Patient ID: Kathlen ModyMelissa Alberson, female   DOB: [birth year], [age] y.o.   MRN: [id number] Hospital Day: 3  S: Preterm labor symptoms: fluid leakage  O: Blood pressure 105/53, pulse 98, temperature 98.2 F (36.8 C), temperature source Oral, resp. rate 16, height 4' 11.5" (1.511 m), weight 125 lb 3.2 oz (56.79 kg), last menstrual period 03/14/2014, SpO2 99.00%.   YQM:VHQIONGEFHT:Baseline: 150 bpm Toco: None XBM:WUXLKGMWSVE:Dilation: 1 Effacement (%): Thick Station: Ballotable Exam by:: Venia CarbonJennifer Rasch NP  A/P- 18 y.o. admitted with:  PPROM.  Stable.  Continue bedrest.  Present on Admission:  . Indication for care in labor and delivery, antepartum  Pregnancy Complications: PPROM  Preterm labor management: bedrest advised Dating:  510w6d PNL Needed:  none FWB:  good PTL:  stable ROD: spontaneous vaginal

## 2014-10-01 NOTE — Consult Note (Signed)
MFM Note  [name] is a 18 year old G2P1A0 Hispanic female at 30+6 weeks who was admitted 2 days ago with spontaneous rupture of membranes. Her prenatal course had been uneventful until now. Since admission, she has received BMZ x 2, magnesium sulfate and latency antibiotics. Currently, she is without complaint and reports excellent fetal movement. She continues to leak clear fluid and denies vaginal bleeding and contractions.  US on admission: EGA - 30+4 weeks by a 23 week US; cephalic presentation; low normal AFV; EFW = 1,646 grams (3+10) which is at the 59th %tile; no structural abnormalities identified  OB history: SVD at 36+ weeks; female 6+3; spontaneous onset of labor  No significant medical or surgical history.  VS: temp 98.2; P 107; BP 108/44; wt. 126 Uterus - NT FHTs: 140s with excellent BTBV WBCs = 15,200; H/H 11.5/33.2 - admission labs GBS = neg  Assessment:  1) SIUP at 30+6 weeks; cephalic presentation 2) PPROM x 2 days; no s/s of intrauterine infection 3) S/P BMZ x 2 4) S/P magnesium sulfate 5) Receiving latency antibiotics 6) S/P neonatal consultation  Recommendations: 1) Complete latency antibiotics 2) Monitor for s/s of IUI 3) Induce at 34 weeks or before with any evidence of chorioamnionitis  (Face-to-face consultation with patient: 30 min)

## 2014-10-02 LAB — TYPE AND SCREEN
ABO/RH(D): O POS
ANTIBODY SCREEN: NEGATIVE

## 2014-10-02 MED ORDER — AMPICILLIN 500 MG PO CAPS
500.0000 mg | ORAL_CAPSULE | Freq: Four times a day (QID) | ORAL | Status: DC
  Administered 2014-10-02 – 2014-10-06 (×18): 500 mg via ORAL
  Filled 2014-10-02 (×18): qty 1

## 2014-10-02 NOTE — Plan of Care (Signed)
Problem: Consults Goal: NICU Tour Outcome: Progressing NICU MD consulted on 09/29/14.

## 2014-10-02 NOTE — Plan of Care (Signed)
Problem: Consults Goal: Birthing Suites Patient Information Press F2 to bring up selections list  Outcome: Not Applicable Date Met:  57/89/78 Patient is admitted to antenatal for PROM and prolonged hospitalization.

## 2014-10-02 NOTE — Progress Notes (Signed)
UR completed 

## 2014-10-02 NOTE — Plan of Care (Signed)
Problem: Consults Goal: Teacher, musicVolunteer Services (Diversional Activities, Age Appropriate) Outcome: Progressing Informed patient of activities and volunteer services that are available for antenatal patients. Patient receptive.

## 2014-10-02 NOTE — Plan of Care (Signed)
Problem: Phase II Progression Outcomes Goal: Labs/tests as ordered Labs/tests as ordered (Magnesium level, CBG's, CBC, CMET, 24 hr Urine, Amniocentesis, Ultrasound, Other)  Outcome: Progressing Instructed patient on type and screens and ultrasounds that are applicable to antenatal patients. Patient receptive.

## 2014-10-02 NOTE — Progress Notes (Signed)
Patient ID: Lindsay Bauer, female   DOB: 07-28-1996, 18 y.o.   MRN: [id number] Hospital Day: 4  S: Preterm labor symptoms: fluid leakage  O: Blood pressure 105/57, pulse 99, temperature 98.1 F (36.7 C), temperature source Oral, resp. rate 16, height 4' 11.5" (1.511 m), weight 57.516 kg (126 lb 12.8 oz), last menstrual period 03/14/2014, SpO2 99.00%.   NWG:NFAOZHYQFHT:Baseline: 150 bpm Toco: None MVH:QIONGEXBSVE:Dilation: 1 Effacement (%): Thick Station: Ballotable Exam by:: Venia CarbonJennifer Rasch NP  A/P- 18 y.o. admitted with:  PPROM.  Stable.   Present on Admission:  . Indication for care in labor and delivery, antepartum  Pregnancy Complications: PPROM  Preterm labor management: bedrest advised Dating:  9014w0d  PNL Needed:  none FWB:  good PTL:  stable ROD: spontaneous vaginal

## 2014-10-03 MED ORDER — LACTATED RINGERS IV BOLUS (SEPSIS)
500.0000 mL | Freq: Once | INTRAVENOUS | Status: AC
  Administered 2014-10-03: 500 mL via INTRAVENOUS

## 2014-10-03 NOTE — Progress Notes (Signed)
Patient ID: Lindsay ModyMelissa Bauer, female   DOB: 1996/05/07, 18 y.o.   MRN: [id number] Hospital Day: 5  S: Preterm labor symptoms: fluid leakage  O: Blood pressure 100/50, pulse 97, temperature 98.1 F (36.7 C), temperature source Oral, resp. rate 20, height 4' 11.5" (1.511 m), weight 126 lb 12.8 oz (57.516 kg), last menstrual period 03/14/2014, SpO2 99.00%.   WGN:FAOZHYQMFHT:Baseline: 155 bpm Toco: None VHQ:IONGEXBMSVE:Dilation: 1 Effacement (%): Thick Station: Ballotable Exam by:: Venia CarbonJennifer Rasch NP  A/P- [age] y.o. admitted with:  PPROM.  Stable.  Continue bedrest.  Present on Admission:  . Indication for care in labor and delivery, antepartum  Pregnancy Complications: PPROM  Preterm labor management: bedrest advised Dating:  1534w1d PNL Needed:  none FWB:  good PTL:  stable ROD: induced vaginal

## 2014-10-03 NOTE — Plan of Care (Signed)
Problem: Phase I Progression Outcomes Goal: Contractions < 5-6/hour Outcome: Progressing Pt denies contractions and none noted on the monitor Goal: Maintains reassuring Fetal Heart Rate Outcome: Progressing reassurring FHR on tracings Goal: OOB as tolerated unless otherwise ordered Outcome: Progressing Pt up to BR and showers     Problem: Phase II Progression Outcomes Goal: Tolerating diet Outcome: Progressing Pt eating her meals

## 2014-10-04 NOTE — Progress Notes (Signed)
Patient ID: Lindsay ModyMelissa Warmuth, female   DOB: 01-31-1996, 18 y.o.   MRN: [id number] Vital signs normal Still leaking no contractions no change in the status quo

## 2014-10-05 LAB — TYPE AND SCREEN
ABO/RH(D): O POS
Antibody Screen: NEGATIVE

## 2014-10-05 NOTE — Progress Notes (Signed)
Patient ID: Lindsay Bauer, female   DOB: [birth year], 18 y.o.   MRN: [id number] Vital signs normal Condition unchanged

## 2014-10-06 ENCOUNTER — Inpatient Hospital Stay (HOSPITAL_COMMUNITY): Payer: Medicaid Other

## 2014-10-06 NOTE — Progress Notes (Signed)
Patient ID: Kathlen ModyMelissa Gariepy, female   DOB: [birth year], 18 y.o.   MRN: [id number] Hospital Day: 8  S: Preterm labor symptoms: fluid leakage  O: Blood pressure 112/60, pulse 84, temperature 98.1 F (36.7 C), temperature source Oral, resp. rate 18, height 4' 11.5" (1.511 m), weight 126 lb 12.8 oz (57.516 kg), last menstrual period 03/14/2014, SpO2 99.00%.   MVH:QIONGEXBFHT:Baseline: 150 bpm Toco: None MWU:XLKGMWNUSVE:Dilation: 1 Effacement (%): Thick Station: Ballotable Exam by:: Venia CarbonJennifer Rasch NP  A/P- 18 y.o. admitted with:  PPROM at 30 weeks.  Stable.  Continue bedrest.  Present on Admission:  . Indication for care in labor and delivery, antepartum  Pregnancy Complications: none  Preterm labor management: bedrest advised Dating:  2879w4d PNL Needed:  none FWB:  good PTL:  stable ROD: induced vaginal at 34 weeks.

## 2014-10-06 NOTE — Progress Notes (Signed)
Antenatal Nutrition Assessment:  Currently  31 4/[redacted] weeks gestation, with PTL, leaking. Height  59.5 "  Weight 126 lbs  pre-pregnancy weight 102 lbs .  Pre-pregnancy  BMI 20.3  IBW 90-100 lbs Total weight gain 24.lbs Weight gain goals 25-35 lbs Estimated needs: 1500-1700 kcal/day, 50-60 grams protein/day, 1.8 liters fluid/day  Regular diet tolerated well, appetite good. Snack menu provided Current diet prescription will provide for increased needs.  No abnormal nutrition related labs  Nutrition Dx: Increased nutrient needs r/t pregnancy and fetal growth requirements aeb [redacted] weeks gestation.  No educational needs assessed at this time.  Elisabeth CaraKatherine Maite Burlison M.Odis LusterEd. R.D. LDN Neonatal Nutrition Support Specialist/RD III Pager [phone]

## 2014-10-07 ENCOUNTER — Encounter (HOSPITAL_COMMUNITY): Payer: Self-pay | Admitting: General Practice

## 2014-10-07 DIAGNOSIS — O479 False labor, unspecified: Secondary | ICD-10-CM | POA: Diagnosis present

## 2014-10-07 DIAGNOSIS — O4700 False labor before 37 completed weeks of gestation, unspecified trimester: Secondary | ICD-10-CM | POA: Diagnosis present

## 2014-10-07 LAB — CBC
HCT: 34.5 % — ABNORMAL LOW (ref 36.0–46.0)
HEMATOCRIT: 34.6 % — AB (ref 36.0–46.0)
HEMOGLOBIN: 11.6 g/dL — AB (ref 12.0–15.0)
Hemoglobin: 11.7 g/dL — ABNORMAL LOW (ref 12.0–15.0)
MCH: 32.7 pg (ref 26.0–34.0)
MCH: 32.8 pg (ref 26.0–34.0)
MCHC: 33.6 g/dL (ref 30.0–36.0)
MCHC: 33.8 g/dL (ref 30.0–36.0)
MCV: 96.9 fL (ref 78.0–100.0)
MCV: 97.2 fL (ref 78.0–100.0)
Platelets: 276 10*3/uL (ref 150–400)
Platelets: 283 10*3/uL (ref 150–400)
RBC: 3.55 MIL/uL — AB (ref 3.87–5.11)
RBC: 3.57 MIL/uL — AB (ref 3.87–5.11)
RDW: 12.6 % (ref 11.5–15.5)
RDW: 12.8 % (ref 11.5–15.5)
WBC: 13.3 10*3/uL — AB (ref 4.0–10.5)
WBC: 18.1 10*3/uL — AB (ref 4.0–10.5)

## 2014-10-07 MED ORDER — MEASLES, MUMPS & RUBELLA VAC ~~LOC~~ INJ
0.5000 mL | INJECTION | Freq: Once | SUBCUTANEOUS | Status: DC
  Filled 2014-10-07: qty 0.5

## 2014-10-07 MED ORDER — OXYTOCIN 40 UNITS IN LACTATED RINGERS INFUSION - SIMPLE MED
62.5000 mL/h | INTRAVENOUS | Status: DC
  Filled 2014-10-07: qty 1000

## 2014-10-07 MED ORDER — BUTORPHANOL TARTRATE 1 MG/ML IJ SOLN: 1.0000 mg | INTRAMUSCULAR | Status: DC | PRN

## 2014-10-07 MED ORDER — FLEET ENEMA 7-19 GM/118ML RE ENEM: 1.0000 | ENEMA | RECTAL | Status: DC | PRN

## 2014-10-07 MED ORDER — ONDANSETRON HCL 4 MG/2ML IJ SOLN: 4.0000 mg | INTRAMUSCULAR | Status: DC | PRN

## 2014-10-07 MED ORDER — OXYCODONE-ACETAMINOPHEN 5-325 MG PO TABS: 2.0000 | ORAL_TABLET | ORAL | Status: DC | PRN

## 2014-10-07 MED ORDER — ONDANSETRON HCL 4 MG/2ML IJ SOLN: 4.0000 mg | Freq: Four times a day (QID) | INTRAMUSCULAR | Status: DC | PRN

## 2014-10-07 MED ORDER — OXYCODONE-ACETAMINOPHEN 5-325 MG PO TABS: 1.0000 | ORAL_TABLET | ORAL | Status: DC | PRN

## 2014-10-07 MED ORDER — ACETAMINOPHEN 325 MG PO TABS: 650.0000 mg | ORAL_TABLET | ORAL | Status: DC | PRN

## 2014-10-07 MED ORDER — DIBUCAINE 1 % RE OINT: 1.0000 "application " | TOPICAL_OINTMENT | RECTAL | Status: DC | PRN

## 2014-10-07 MED ORDER — LANOLIN HYDROUS EX OINT: TOPICAL_OINTMENT | CUTANEOUS | Status: DC | PRN

## 2014-10-07 MED ORDER — BENZOCAINE-MENTHOL 20-0.5 % EX AERO
1.0000 "application " | INHALATION_SPRAY | CUTANEOUS | Status: DC | PRN
  Administered 2014-10-07: 1 via TOPICAL
  Filled 2014-10-07: qty 56

## 2014-10-07 MED ORDER — FERROUS SULFATE 325 (65 FE) MG PO TABS
325.0000 mg | ORAL_TABLET | Freq: Two times a day (BID) | ORAL | Status: DC
  Administered 2014-10-07 – 2014-10-09 (×4): 325 mg via ORAL
  Filled 2014-10-07 (×4): qty 1

## 2014-10-07 MED ORDER — ONDANSETRON HCL 4 MG PO TABS: 4.0000 mg | ORAL_TABLET | ORAL | Status: DC | PRN

## 2014-10-07 MED ORDER — TETANUS-DIPHTH-ACELL PERTUSSIS 5-2.5-18.5 LF-MCG/0.5 IM SUSP
0.5000 mL | Freq: Once | INTRAMUSCULAR | Status: AC
  Administered 2014-10-08: 0.5 mL via INTRAMUSCULAR

## 2014-10-07 MED ORDER — CITRIC ACID-SODIUM CITRATE 334-500 MG/5ML PO SOLN
30.0000 mL | ORAL | Status: DC | PRN
Start: 2014-10-07 — End: 2014-10-07

## 2014-10-07 MED ORDER — PRENATAL MULTIVITAMIN CH
1.0000 | ORAL_TABLET | Freq: Every day | ORAL | Status: DC
  Administered 2014-10-07 – 2014-10-09 (×3): 1 via ORAL
  Filled 2014-10-07 (×3): qty 1

## 2014-10-07 MED ORDER — DIPHENHYDRAMINE HCL 25 MG PO CAPS: 25.0000 mg | ORAL_CAPSULE | Freq: Four times a day (QID) | ORAL | Status: DC | PRN

## 2014-10-07 MED ORDER — SENNOSIDES-DOCUSATE SODIUM 8.6-50 MG PO TABS
2.0000 | ORAL_TABLET | ORAL | Status: DC
  Administered 2014-10-08 – 2014-10-09 (×2): 2 via ORAL
  Filled 2014-10-07: qty 2

## 2014-10-07 MED ORDER — IBUPROFEN 600 MG PO TABS
600.0000 mg | ORAL_TABLET | Freq: Four times a day (QID) | ORAL | Status: DC
  Administered 2014-10-07 – 2014-10-09 (×9): 600 mg via ORAL
  Filled 2014-10-07 (×8): qty 1

## 2014-10-07 MED ORDER — OXYTOCIN BOLUS FROM INFUSION
500.0000 mL | INTRAVENOUS | Status: DC
Start: 2014-10-07 — End: 2014-10-07
  Administered 2014-10-07: 500 mL via INTRAVENOUS

## 2014-10-07 MED ORDER — MAGNESIUM HYDROXIDE 400 MG/5ML PO SUSP: 30.0000 mL | ORAL | Status: DC | PRN

## 2014-10-07 MED ORDER — LACTATED RINGERS IV SOLN
INTRAVENOUS | Status: DC
  Administered 2014-10-07: 01:00:00 via INTRAVENOUS

## 2014-10-07 MED ORDER — ZOLPIDEM TARTRATE 5 MG PO TABS: 5.0000 mg | ORAL_TABLET | Freq: Every evening | ORAL | Status: DC | PRN

## 2014-10-07 MED ORDER — WITCH HAZEL-GLYCERIN EX PADS: 1.0000 "application " | MEDICATED_PAD | CUTANEOUS | Status: DC | PRN

## 2014-10-07 MED ORDER — BUTORPHANOL TARTRATE 1 MG/ML IJ SOLN
2.0000 mg | INTRAMUSCULAR | Status: DC | PRN
  Administered 2014-10-07: 1 mg via INTRAVENOUS
  Filled 2014-10-07: qty 2

## 2014-10-07 MED ORDER — LACTATED RINGERS IV SOLN: 500.0000 mL | INTRAVENOUS | Status: DC | PRN

## 2014-10-07 MED ORDER — LIDOCAINE HCL (PF) 1 % IJ SOLN
30.0000 mL | INTRAMUSCULAR | Status: DC | PRN
  Filled 2014-10-07: qty 30

## 2014-10-07 NOTE — Progress Notes (Signed)
UR completed 

## 2014-10-07 NOTE — Progress Notes (Signed)
Post Partum Day 0 Subjective: no complaints  Objective: Blood pressure 101/44, pulse 86, temperature 98.9 F (37.2 C), temperature source Oral, resp. rate 18, height 4' 11.5" (1.511 m), weight 126 lb 12.8 oz (57.516 kg), last menstrual period 03/14/2014, SpO2 99.00%, unknown if currently breastfeeding.  Physical Exam:  General: alert and no distress Lochia: appropriate Uterine Fundus: firm Incision: none DVT Evaluation: No evidence of DVT seen on physical exam.   Recent Labs  10/07/14 0048 10/07/14 0514  HGB 11.6* 11.7*  HCT 34.5* 34.6*    Assessment/Plan: Plan for discharge tomorrow   LOS: 8 days   HARPER,CHARLES A 10/07/2014, 6:41 AM

## 2014-10-07 NOTE — Lactation Note (Signed)
This note was copied from the chart of Boy Kathlen ModyMelissa Hibner. Lactation Consultation Note   Initial consult with this experienced breast feeding mom of a NICU baby, 31 5/[redacted] weeks gestation, and now about 10 hours old. Mom has been pumping and doing  [place] expression, and expressing 1-2 mls of colostrum. NICU booklet on providing EBM reviewed with mom, and [facility] Mom knows to call for questions/concerns.  Patient Name: Boy Kathlen ModyMelissa Handley [id number]'s Date: 10/07/2014 Reason for consult: Initial assessment   Maternal Data Formula Feeding for Exclusion: Yes (baby in NICU) Has patient been taught Hand Expression?: Yes Does the patient have breastfeeding experience prior to this delivery?: Yes  Feeding    LATCH Score/Interventions                      Lactation Tools Discussed/Used WIC Program: Yes (info faxed to [facility] for momDEP) Pump Review: Setup, frequency, and cleaning;Milk Storage;Other (comment) (premie setting, hand expression) Initiated by:: bedside nurse within 6 hours of delivery Date initiated:: 10/07/14   Consult Status Consult Status: Follow-up Date: 10/08/14 Follow-up type: In-patient    Alfred LevinsLee, Alzada Brazee Anne 10/07/2014, 5:20 PM

## 2014-10-08 ENCOUNTER — Encounter: Payer: Medicaid Other | Admitting: Obstetrics

## 2014-10-08 NOTE — Progress Notes (Signed)
Post Partum Day 1 Subjective: no complaints  Objective: Blood pressure 101/53, pulse 72, temperature 98.4 F (36.9 C), temperature source Oral, resp. rate 18, height 4' 11.5" (1.511 m), weight 126 lb 12.8 oz (57.516 kg), last menstrual period 03/14/2014, SpO2 99.00%, unknown if currently breastfeeding.  Physical Exam:  General: alert and no distress Lochia: appropriate Uterine Fundus: firm Incision: none DVT Evaluation: No evidence of DVT seen on physical exam.   Recent Labs  10/07/14 0048 10/07/14 0514  HGB 11.6* 11.7*  HCT 34.5* 34.6*    Assessment/Plan: Plan for discharge tomorrow   LOS: 9 days   Lindsay Bauer A 10/08/2014, 8:04 AM

## 2014-10-08 NOTE — Lactation Note (Signed)
This note was copied from the chart of Lindsay Bauer. Lactation Consultation Note    Brief follow up consult with this mom of a NICU baby, now 6132 hours old, and 31 6/7 weeks CGA. MOm has [place] excellent milk supply, and was advsied she can now use standard setting. I encouraged her to call [facility] today, info has been faxed. Mom knows to call for questions/concerns.  Patient Name: Lindsay Bauer [id number]'s Date: 10/08/2014     Maternal Data    Feeding Feeding Type: Breast Milk Length of feed: 5 min  LATCH Score/Interventions                      Lactation Tools Discussed/Used     Consult Status      Alfred LevinsLee, Adaiah Morken Anne 10/08/2014, 9:35 AM

## 2014-10-09 MED ORDER — IBUPROFEN 600 MG PO TABS: 600.0000 mg | ORAL_TABLET | Freq: Four times a day (QID) | ORAL | Status: AC | PRN

## 2014-10-09 MED ORDER — OXYCODONE-ACETAMINOPHEN 5-325 MG PO TABS: 1.0000 | ORAL_TABLET | ORAL | Status: AC | PRN

## 2014-10-09 NOTE — Progress Notes (Signed)
Pt ambulated out teaching complete    Pt to go to wic for pump

## 2014-10-09 NOTE — Progress Notes (Signed)
Post Partum Day 2 Subjective: no complaints  Objective: Blood pressure 126/65, pulse 78, temperature 98.2 F (36.8 C), temperature source Oral, resp. rate 18, height 4' 11.5" (1.511 m), weight 126 lb 12.8 oz (57.516 kg), last menstrual period 03/14/2014, SpO2 98.00%, unknown if currently breastfeeding.  Physical Exam:  General: alert and no distress Lochia: appropriate Uterine Fundus: firm Incision: none DVT Evaluation: No evidence of DVT seen on physical exam.   Recent Labs  10/07/14 0048 10/07/14 0514  HGB 11.6* 11.7*  HCT 34.5* 34.6*    Assessment/Plan: Discharge home   LOS: 10 days   Amyr Sluder A 10/09/2014, 8:51 AM

## 2014-10-09 NOTE — Discharge Summary (Signed)
Obstetric Discharge Summary Reason for Admission: rupture of membranes Prenatal Procedures: NST and ultrasound Intrapartum Procedures: spontaneous vaginal delivery Postpartum Procedures: none Complications-Operative and Postpartum: none Hemoglobin  Date Value Ref Range Status  10/07/2014 11.7* 12.0 - 15.0 g/dL Final     HCT  Date Value Ref Range Status  10/07/2014 34.6* 36.0 - 46.0 % Final    Physical Exam:  General: alert and no distress Lochia: appropriate Uterine Fundus: firm Incision: none DVT Evaluation: No evidence of DVT seen on physical exam.  Discharge Diagnoses: PPROM at 29 weeks.  Preterm delivery  Discharge Information: Date: 10/09/2014 Activity: pelvic rest Diet: routine Medications: Ibuprofen, Percocet Condition: stable Instructions: refer to practice specific booklet Discharge to: home Follow-up Information   Follow up with Terrie Haring A, MD In 2 weeks.   Specialty:  Obstetrics and Gynecology   Contact information:   [address] [place] [id number] [phone]       Newborn Data: Live born female  Birth Weight: 3 lb 11.3 oz (1680 g) APGAR: 9, 9  NICU for Prematurity.  Lindsay Bauer A 10/09/2014, 8:53 AM

## 2014-10-09 NOTE — Progress Notes (Signed)
MSW intern attempted to meet with MOB but she was discharged. MSW intern did a chart review and looked through notes and did not identify any psychosocial concerns. MSW intern will continue to follow up at baby's bedside.  

## 2014-10-09 NOTE — Progress Notes (Signed)
Lindsay Kuennen, LCSW participated and supervised Haley Baker, MSW Intern, with the intervention and agree with the intern's documentation.  

## 2014-10-27 ENCOUNTER — Encounter (HOSPITAL_COMMUNITY): Payer: Self-pay | Admitting: General Practice

## 2014-12-01 IMAGING — US US OB COMP +14 WK
2 series · 12 of 28 positions shown · non-contrast
Comparison: none

[Series 1: us ob comp +14 wk · 3 of 27 slices shown (1 of 2)]
[im 6/27]
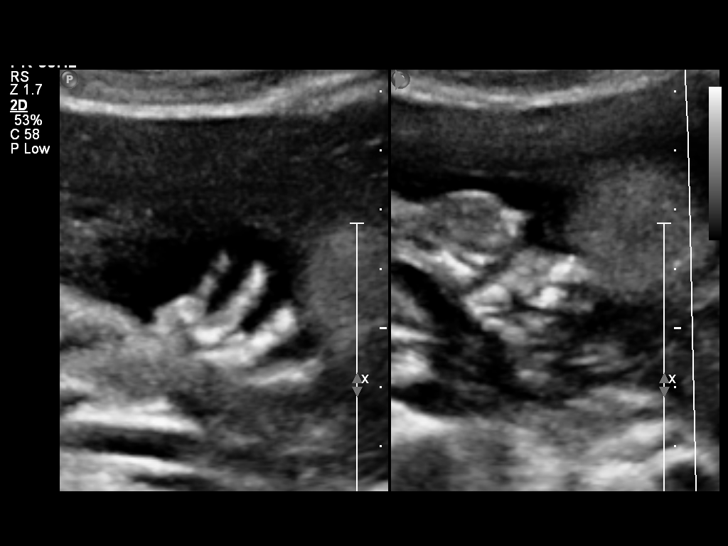
[im 16/27]
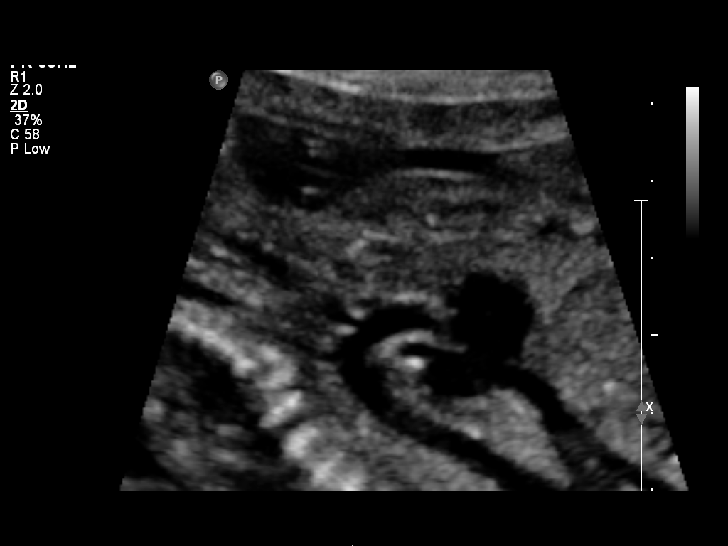
[im 27/27]
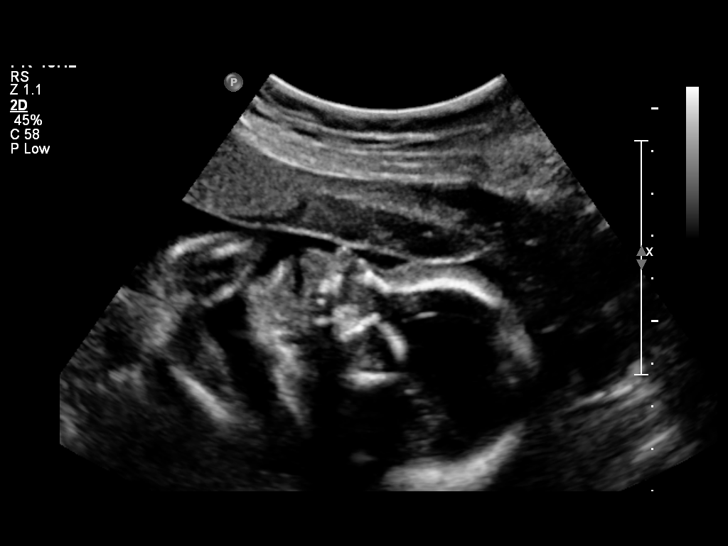

[Series 1: us ob comp +14 wk · 9 of 100 slices shown (2 of 2)]
[im 10/100]
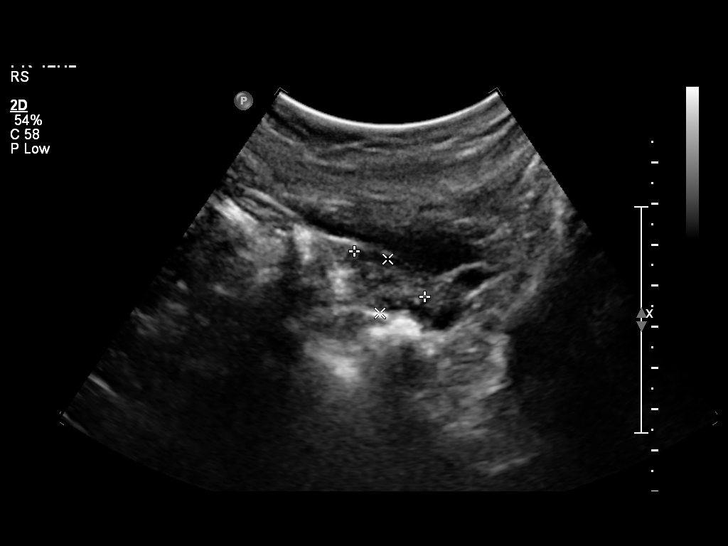
[im 19/100]
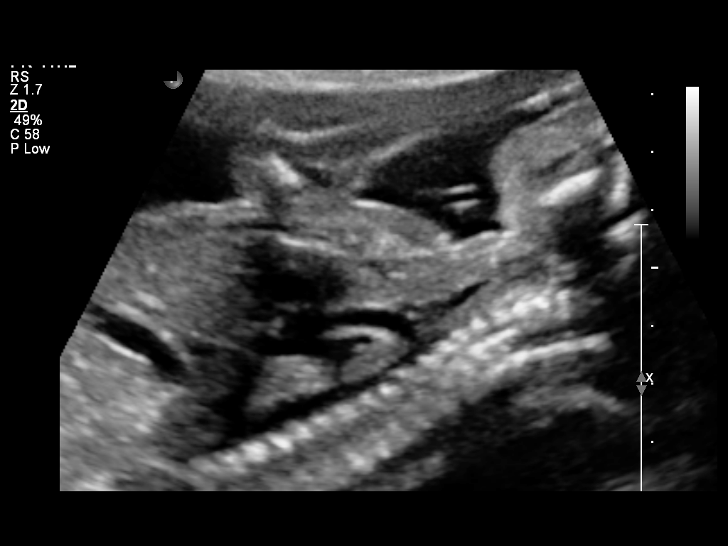
[im 29/100]
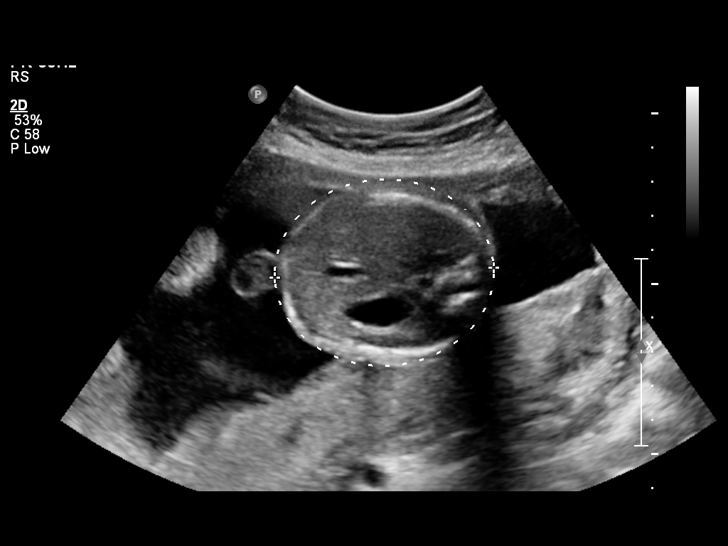
[im 43/100]
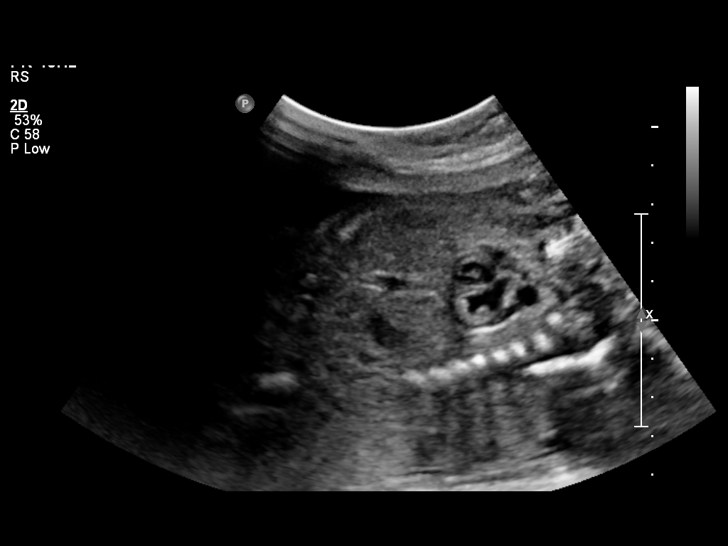
[im 52/100]
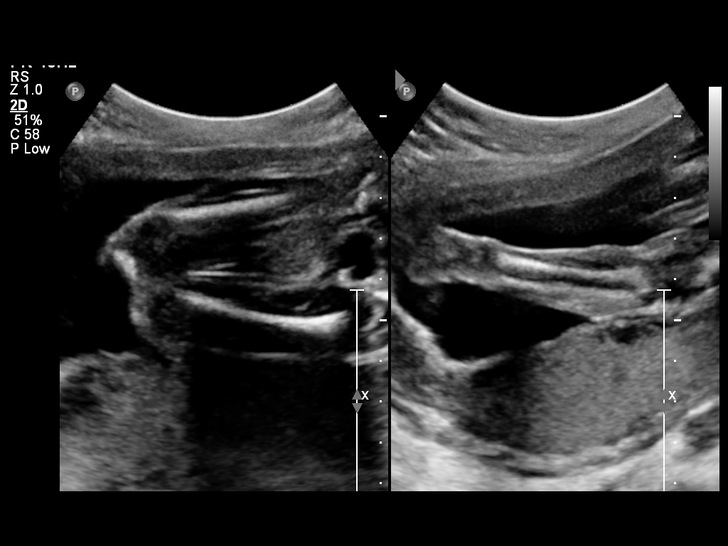
[im 62/100]
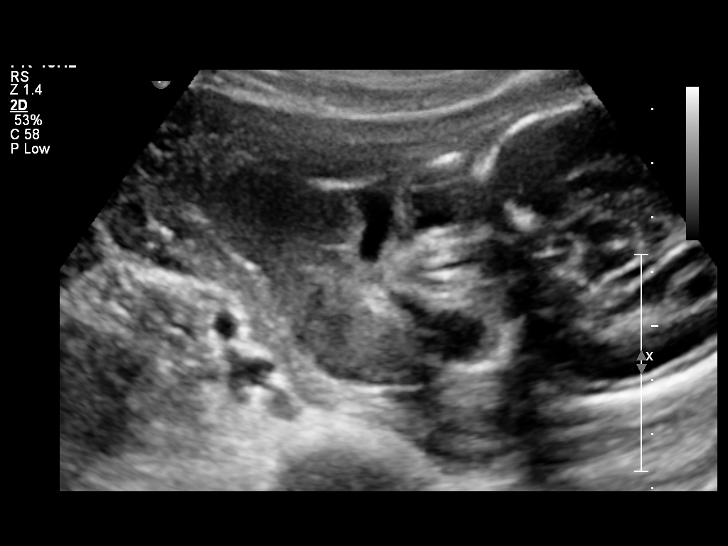
[im 76/100]
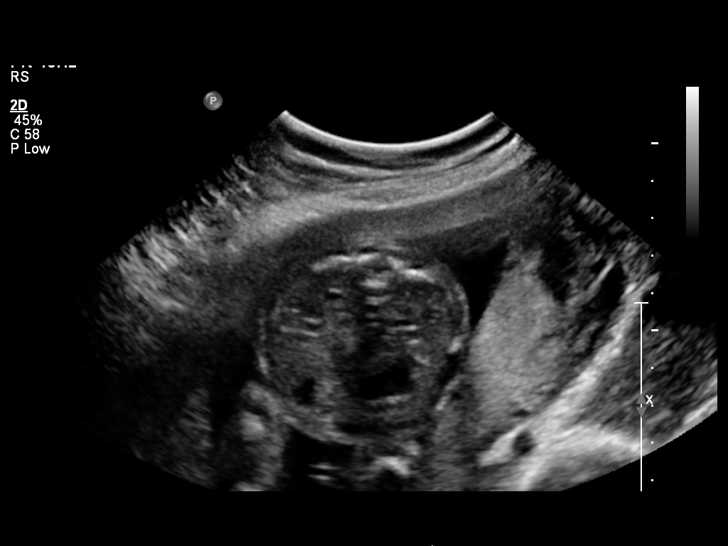
[im 85/100]
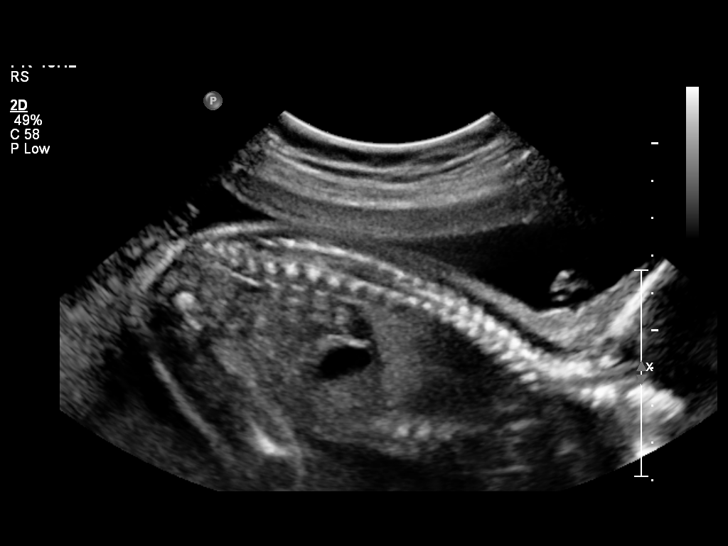
[im 95/100]
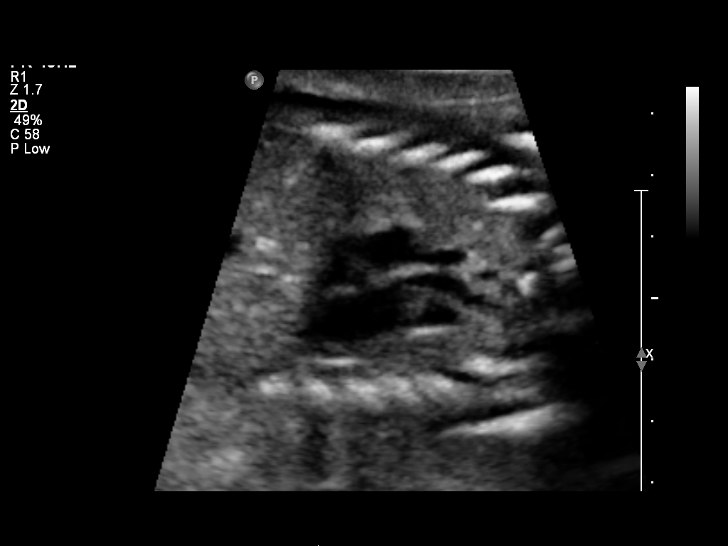

[12 of 28 positions shown; findings below may reference images not displayed]

OBSTETRICS REPORT
                      (Signed Final 08/07/2014 [DATE])

Service(s) Provided

 US OB COMP + 14 WK                                    76805.1
Indications

 Basic anatomic survey
 Teen pregnancy
Fetal Evaluation

 Num Of Fetuses:    1
 Fetal Heart Rate:  149                          bpm
 Cardiac Activity:  Observed
 Presentation:      Cephalic
 Placenta:          Right lateral, above
                    cervical os
 P. Cord            Visualized
 Insertion:

 Amniotic Fluid
 AFI FV:      Subjectively within normal limits
                                             Larg Pckt:     4.1  cm
Biometry

 BPD:     54.4  mm     G. Age:  22w 4d                CI:        68.77   70 - 86
                                                      FL/HC:      19.0   19.2 -

 HC:     209.6  mm     G. Age:  23w 0d       37  %    HC/AC:      1.12   1.05 -

 AC:     187.6  mm     G. Age:  23w 4d       59  %    FL/BPD:     73.2   71 - 87
 FL:      39.8  mm     G. Age:  22w 6d       34  %    FL/AC:      21.2   20 - 24
 HUM:     37.3  mm     G. Age:  23w 0d       44  %
 CER:     23.3  mm     G. Age:  21w 5d       25  %

 Est. FW:     570  gm      1 lb 4 oz     56  %
Gestational Age

 LMP:           20w 6d        Date:  03/14/14                 EDD:   12/19/14
 U/S Today:     23w 0d                                        EDD:   12/04/14
 Best:          23w 0d     Det. By:  U/S (08/07/14)           EDD:   12/04/14
Anatomy

 Cranium:          Appears normal         Aortic Arch:      Appears normal
 Fetal Cavum:      Appears normal         Ductal Arch:      Appears normal
 Ventricles:       Appears normal         Diaphragm:        Appears normal
 Choroid Plexus:   Appears normal         Stomach:          Appears normal, left
                                                            sided
 Cerebellum:       Appears normal         Abdomen:          Appears normal
 Posterior Fossa:  Appears normal         Abdominal Wall:   Appears nml (cord
                                                            insert, abd wall)
 Nuchal Fold:      Not applicable (>20    Cord Vessels:     Appears normal (3
                   wks GA)                                  vessel cord)
 Face:             Appears normal         Kidneys:          Appear normal
                   (orbits and profile)
 Lips:             Appears normal         Bladder:          Appears normal
 Heart:            Appears normal         Spine:            Appears normal
                   (4CH, axis, and
                   situs)
 RVOT:             Appears normal         Lower             Appears normal
                                          Extremities:
 LVOT:             Appears normal         Upper             Appears normal
                                          Extremities:

 Other:  Male gender. Heels and 5th digit visualized.
Targeted Anatomy

 Fetal Central Nervous System
 Cisterna Magna:
Cervix Uterus Adnexa

 Cervical Length:    3.39     cm

 Cervix:       Normal appearance by transabdominal scan.

 Left Ovary:    Size(cm) L: 2.04 x W: 1.43 x H: 1.33  Volume(cc): 2
 Right Ovary:   Size(cm) L: 1.85 x W: 1.82 x H: 1.63  Volume(cc):

 Adnexa:     No abnormality visualized.
Impression

 SIUP at 23+0 weeks
 Normal detailed fetal anatomy
 Normal amniotic fluid volume
 EDC based on today's measurements; EFW at the 56th %tile

Recommendations

 Follow-up as clinically indicated

 questions or concerns.

## 2018-03-06 ENCOUNTER — Other Ambulatory Visit: Payer: Self-pay

## 2018-03-06 ENCOUNTER — Encounter (HOSPITAL_COMMUNITY): Payer: Self-pay | Admitting: Emergency Medicine

## 2018-03-06 DIAGNOSIS — R109 Unspecified abdominal pain: Secondary | ICD-10-CM | POA: Insufficient documentation

## 2018-03-06 DIAGNOSIS — Z87891 Personal history of nicotine dependence: Secondary | ICD-10-CM | POA: Insufficient documentation

## 2018-03-06 LAB — URINALYSIS, ROUTINE W REFLEX MICROSCOPIC
Bilirubin Urine: NEGATIVE
Glucose, UA: NEGATIVE mg/dL
Hgb urine dipstick: NEGATIVE
Ketones, ur: NEGATIVE mg/dL
Leukocytes, UA: NEGATIVE
Nitrite: NEGATIVE
PH: 8 (ref 5.0–8.0)
Protein, ur: NEGATIVE mg/dL
Specific Gravity, Urine: 1.013 (ref 1.005–1.030)

## 2018-03-06 LAB — COMPREHENSIVE METABOLIC PANEL
ALBUMIN: 3.8 g/dL (ref 3.5–5.0)
ALT: 11 U/L — ABNORMAL LOW (ref 14–54)
ANION GAP: 9 (ref 5–15)
AST: 16 U/L (ref 15–41)
Alkaline Phosphatase: 48 U/L (ref 38–126)
BUN: 10 mg/dL (ref 6–20)
CO2: 25 mmol/L (ref 22–32)
Calcium: 9.4 mg/dL (ref 8.9–10.3)
Chloride: 104 mmol/L (ref 101–111)
Creatinine, Ser: 0.68 mg/dL (ref 0.44–1.00)
GFR calc Af Amer: >60 mL/min (ref >60)
GFR calc non Af Amer: >60 mL/min (ref >60)
Glucose, Bld: 116 mg/dL — ABNORMAL HIGH (ref 65–99)
POTASSIUM: 3.7 mmol/L (ref 3.5–5.1)
Sodium: 138 mmol/L (ref 135–145)
Total Bilirubin: 0.2 mg/dL — ABNORMAL LOW (ref 0.3–1.2)
Total Protein: 6.7 g/dL (ref 6.5–8.1)

## 2018-03-06 LAB — CBC WITH DIFFERENTIAL/PLATELET
BASOS PCT: 1 %
Basophils Absolute: 0 10*3/uL (ref 0.0–0.1)
EOS ABS: 0.4 10*3/uL (ref 0.0–0.7)
EOS PCT: 7 %
HCT: 39 % (ref 36.0–46.0)
Hemoglobin: 13.3 g/dL (ref 12.0–15.0)
Lymphocytes Relative: 37 %
Lymphs Abs: 2.3 10*3/uL (ref 0.7–4.0)
MCH: 32.8 pg (ref 26.0–34.0)
MCHC: 34.1 g/dL (ref 30.0–36.0)
MCV: 96.1 fL (ref 78.0–100.0)
Monocytes Absolute: 0.5 10*3/uL (ref 0.1–1.0)
Monocytes Relative: 8 %
NEUTROS PCT: 47 %
Neutro Abs: 2.9 10*3/uL (ref 1.7–7.7)
PLATELETS: 281 10*3/uL (ref 150–400)
RBC: 4.06 MIL/uL (ref 3.87–5.11)
RDW: 12.4 % (ref 11.5–15.5)
WBC: 6.1 10*3/uL (ref 4.0–10.5)

## 2018-03-06 LAB — I-STAT BETA HCG BLOOD, ED (MC, WL, AP ONLY): I-stat hCG, quantitative: <5 m[IU]/mL (ref <5)

## 2018-03-06 LAB — LIPASE, BLOOD: Lipase: 24 U/L (ref 11–51)

## 2018-03-06 NOTE — ED Triage Notes (Signed)
Patient reports pain across her abdomen with emesis onset Sunday , no diarrhea or fever .

## 2018-03-07 ENCOUNTER — Emergency Department (HOSPITAL_COMMUNITY)
Admission: EM | Admit: 2018-03-07 | Discharge: 2018-03-07 | Disposition: A | Discharge status: Home / Self Care | Payer: Self-pay | Attending: Emergency Medicine | Admitting: Emergency Medicine

## 2018-03-07 DIAGNOSIS — R109 Unspecified abdominal pain: Secondary | ICD-10-CM

## 2018-03-07 MED ORDER — ONDANSETRON 4 MG PO TBDP: 4.0000 mg | ORAL_TABLET | Freq: Three times a day (TID) | ORAL | 0 refills | Status: AC | PRN

## 2018-03-07 NOTE — ED Notes (Signed)
ED Provider at bedside. 

## 2018-03-07 NOTE — ED Notes (Signed)
Patient verbalizes understanding of discharge instructions. Opportunity for questioning and answers were provided. Armband removed by staff, pt discharged from ED. E signature not available at this time.  

## 2018-03-07 NOTE — ED Provider Notes (Signed)
[facility] EMERGENCY DEPARTMENT Provider Note   CSN: [id number] Arrival date & time: 03/06/18  2118     History   Chief Complaint Chief Complaint  Patient presents with  . Abdominal Pain    HPI Lindsay Bauer is a 22 y.o. female.  Patient presents to the emergency department with a chief complaint of lower abdominal pain.  She states the symptoms started on Sunday.  She reports having some nausea and vomiting.  She has tried taking Tums with no relief.  She denies any diarrhea.  Denies any vaginal discharge or dysuria.  She denies any fevers or chills.  Denies any other associated symptoms.  There are no modifying factors.   The history is provided by the patient. No language interpreter was used.    Past Medical History:  Diagnosis Date  . No pertinent past medical history     Patient Active Problem List   Diagnosis Date Noted  . Preterm contractions 10/07/2014  . Preterm delivery, delivered 10/07/2014  . Indication for care in labor and delivery, antepartum 09/29/2014  . Supervision of normal first teen pregnancy in second trimester 08/07/2014    Past Surgical History:  Procedure Laterality Date  . NO PAST SURGERIES      OB History    Gravida Para Term Preterm AB Living   2 2 0 2 0 1   SAB TAB Ectopic Multiple Live Births   0 0 0 0 1       Home Medications    Prior to Admission medications   Medication Sig Start Date End Date Taking? Authorizing Provider  ibuprofen (ADVIL,MOTRIN) 600 MG tablet Take 1 tablet (600 mg total) by mouth every 6 (six) hours as needed for mild pain. Patient not taking: Reported on 03/07/2018 10/09/14   Brock Bad, MD  oxyCODONE-acetaminophen (PERCOCET/ROXICET) 5-325 MG per tablet Take 1-2 tablets by mouth every 4 (four) hours as needed for moderate pain or severe pain (for pain scale equal to or greater than 7). Patient not taking: Reported on 03/07/2018 10/09/14   Brock Bad, MD    Family  History No family history on file.  Social History Social History   Tobacco Use  . Smoking status: Former Smoker    Types: Cigarettes    Last attempt to quit: 02/01/2012    Years since quitting: 6.0  . Smokeless tobacco: Never Used  Substance Use Topics  . Alcohol use: No    Comment: on weekends  . Drug use: Yes    Types: Marijuana     Allergies   Patient has no known allergies.   Review of Systems Review of Systems  All other systems reviewed and are negative.    Physical Exam Updated Vital Signs BP 121/78   Pulse 86   Temp 98.8 F (37.1 C) (Oral)   Resp 16   Ht 4\' 11"  (1.499 m)   Wt 48.5 kg (107 lb)   LMP 01/27/2018   SpO2 97%   BMI 21.61 kg/m   Physical Exam  Constitutional: She is oriented to person, place, and time. She appears well-developed and well-nourished.  HENT:  Head: Normocephalic and atraumatic.  Eyes: Conjunctivae and EOM are normal. Pupils are equal, round, and reactive to light.  Neck: Normal range of motion. Neck supple.  Cardiovascular: Normal rate and regular rhythm. Exam reveals no gallop and no friction rub.  No murmur heard. Pulmonary/Chest: Effort normal and breath sounds normal. No respiratory distress. She has no wheezes. She  has no rales. She exhibits no tenderness.  Abdominal: Soft. Bowel sounds are normal. She exhibits no distension and no mass. There is no tenderness. There is no rebound and no guarding.  No focal abdominal tenderness, no RLQ tenderness or pain at McBurney's point, no RUQ tenderness or Murphy's sign, no left-sided abdominal tenderness, no fluid wave, or signs of peritonitis   Musculoskeletal: Normal range of motion. She exhibits no edema or tenderness.  Neurological: She is alert and oriented to person, place, and time.  Skin: Skin is warm and dry.  Psychiatric: She has a normal mood and affect. Her behavior is normal. Judgment and thought content normal.  Nursing note and vitals reviewed.    ED Treatments /  Results  Labs (all labs ordered are listed, but only abnormal results are displayed) Labs Reviewed  COMPREHENSIVE METABOLIC PANEL - Abnormal; Notable for the following components:      Result Value   Glucose, Bld 116 (*)    ALT 11 (*)    Total Bilirubin 0.2 (*)    All other components within normal limits  URINALYSIS, ROUTINE W REFLEX MICROSCOPIC - Abnormal; Notable for the following components:   APPearance CLOUDY (*)    All other components within normal limits  CBC WITH DIFFERENTIAL/PLATELET  LIPASE, BLOOD  I-STAT BETA HCG BLOOD, ED ([facility], [facility], [facility] ONLY)    EKG  EKG Interpretation None       Radiology No results found.  Procedures Procedures (including critical care time)  Medications Ordered in ED Medications - No data to display   Initial Impression / Assessment and Plan / ED Course  I have reviewed the triage vital signs and the nursing notes.  Pertinent labs & imaging results that were available during my care of the patient were reviewed by me and considered in my medical decision making (see chart for details).     Patient with abdominal discomfort times 3 days.  Vital signs are stable.  Laboratory workup is unremarkable.  No leukocytosis.  Patient is not pregnant.  Urinalysis is not consistent with UTI.  Abdomen is soft and nontender.  She has no focal abdominal tenderness.  Doubt surgical or acute abdomen.  Final Clinical Impressions(s) / ED Diagnoses   Final diagnoses:  Abdominal pain, unspecified abdominal location    ED Discharge Orders    None       Roxy HorsemanBrowning, Dream Nodal, PA-C 03/07/18 0335    Zadie RhineWickline, Donald, MD 03/07/18 0730

## 2018-10-31 ENCOUNTER — Ambulatory Visit (INDEPENDENT_AMBULATORY_CARE_PROVIDER_SITE_OTHER): Payer: Self-pay

## 2018-10-31 VITALS — BP 124/80 | HR 65 | Ht 59.0 in | Wt 110.4 lb

## 2018-10-31 DIAGNOSIS — Z3201 Encounter for pregnancy test, result positive: Secondary | ICD-10-CM

## 2018-10-31 LAB — POCT URINE PREGNANCY: Preg Test, Ur: POSITIVE — AB

## 2018-10-31 NOTE — Progress Notes (Signed)
Lindsay Bauer presents today for UPT. She has no unusual complaints. LMP:09/14/18    OBJECTIVE: Appears well, in no apparent distress.  OB History    Gravida  3   Para  2   Term  0   Preterm  2   AB  0   Living  1     SAB  0   TAB  0   Ectopic  0   Multiple  0   Live Births  1          Home UPT Result: Positive x 2 In-Office UPT result: POSITIVE  I have reviewed the patient's medical, obstetrical, social, and family histories, and medications.   ASSESSMENT: Positive pregnancy test  PLAN Prenatal care to be completed at: Patient is considering TAB.

## 2022-10-05 ENCOUNTER — Other Ambulatory Visit: Payer: Self-pay

## 2022-10-05 ENCOUNTER — Encounter (HOSPITAL_COMMUNITY): Payer: Self-pay

## 2022-10-05 ENCOUNTER — Emergency Department (HOSPITAL_COMMUNITY): Payer: Medicaid Other

## 2022-10-05 ENCOUNTER — Emergency Department (HOSPITAL_COMMUNITY)
Admission: EM | Admit: 2022-10-05 | Discharge: 2022-10-05 | Discharge status: Against Medical Advice | Payer: Medicaid Other | Attending: Student | Admitting: Student

## 2022-10-05 DIAGNOSIS — R1013 Epigastric pain: Secondary | ICD-10-CM | POA: Insufficient documentation

## 2022-10-05 DIAGNOSIS — R112 Nausea with vomiting, unspecified: Secondary | ICD-10-CM | POA: Insufficient documentation

## 2022-10-05 DIAGNOSIS — Z5321 Procedure and treatment not carried out due to patient leaving prior to being seen by health care provider: Secondary | ICD-10-CM | POA: Insufficient documentation

## 2022-10-05 DIAGNOSIS — R079 Chest pain, unspecified: Secondary | ICD-10-CM | POA: Insufficient documentation

## 2022-10-05 LAB — BASIC METABOLIC PANEL
Anion gap: 8 (ref 5–15)
BUN: 8 mg/dL (ref 6–20)
CO2: 22 mmol/L (ref 22–32)
Calcium: 9.2 mg/dL (ref 8.9–10.3)
Chloride: 107 mmol/L (ref 98–111)
Creatinine, Ser: 0.75 mg/dL (ref 0.44–1.00)
GFR, Estimated: >60 mL/min (ref >60)
Glucose, Bld: 91 mg/dL (ref 70–99)
Potassium: 3.7 mmol/L (ref 3.5–5.1)
Sodium: 137 mmol/L (ref 135–145)

## 2022-10-05 LAB — CBC
HCT: 40.3 % (ref 36.0–46.0)
Hemoglobin: 13.9 g/dL (ref 12.0–15.0)
MCH: 33.5 pg (ref 26.0–34.0)
MCHC: 34.5 g/dL (ref 30.0–36.0)
MCV: 97.1 fL (ref 80.0–100.0)
Platelets: 407 10*3/uL — ABNORMAL HIGH (ref 150–400)
RBC: 4.15 MIL/uL (ref 3.87–5.11)
RDW: 11.9 % (ref 11.5–15.5)
WBC: 11.2 10*3/uL — ABNORMAL HIGH (ref 4.0–10.5)
nRBC: 0 % (ref 0.0–0.2)

## 2022-10-05 LAB — I-STAT BETA HCG BLOOD, ED (MC, WL, AP ONLY): I-stat hCG, quantitative: <5 m[IU]/mL (ref <5)

## 2022-10-05 LAB — HEPATIC FUNCTION PANEL
ALT: 11 U/L (ref 0–44)
AST: 17 U/L (ref 15–41)
Albumin: 4.2 g/dL (ref 3.5–5.0)
Alkaline Phosphatase: 52 U/L (ref 38–126)
Bilirubin, Direct: 0.1 mg/dL (ref 0.0–0.2)
Indirect Bilirubin: 0.6 mg/dL (ref 0.3–0.9)
Total Bilirubin: 0.7 mg/dL (ref 0.3–1.2)
Total Protein: 7 g/dL (ref 6.5–8.1)

## 2022-10-05 LAB — TROPONIN I (HIGH SENSITIVITY): Troponin I (High Sensitivity): <2 ng/L (ref <18)

## 2022-10-05 LAB — LIPASE, BLOOD: Lipase: 31 U/L (ref 11–51)

## 2022-10-05 NOTE — ED Triage Notes (Addendum)
Pt reports central chest burning that has been going on for 2 months associated with vomiting. Pain worse when she eats spicy foods and at night when she is laying down.

## 2022-10-05 NOTE — ED Provider Triage Note (Signed)
Emergency Medicine Provider Triage Evaluation Note  Lindsay Bauer , a 26 y.o. female  was evaluated in triage.  Pt complains of chest pain.  Patient describes it as a burning sensation that is intermittent.  Worse after eating spicy foods and when she lies down at night.  She reports some epigastric abdominal discomfort as well.  She reports associated nausea and vomiting.  No diarrhea.  No history of PE/DVT, prolonged immobilization, recent hospitalization/surgeries, unilateral leg swelling or calf tenderness, hemoptysis.  Denies any cardiac history.  She has been taking Tums and a "powder" for acid reflux but that does not seem to help with her symptoms.  History of same but never lasted this long..  Review of Systems  Positive: Chest pain, vomiting, abdominal pain Negative: Shortness of breath, leg swelling  Physical Exam  BP 136/86 (BP Location: Right Arm)   Pulse 75   Temp 98.3 F (36.8 C) (Oral)   Resp 14   Ht 4\' 11"  (1.499 m)   Wt 45.4 kg   LMP 09/07/2022 (Approximate)   SpO2 100%   Breastfeeding No   BMI 20.20 kg/m  Gen:   Awake, no distress   Resp:  Normal effort  MSK:   Moves extremities without difficulty  Other:  Heart regular rate and rhythm.  Lungs clear to auscultation bilaterally.  Patient neurovascularly intact in all extremities.  No focal abdominal tenderness.  Medical Decision Making  Medically screening exam initiated at 6:17 PM.  Appropriate orders placed.  Lindsay Bauer was informed that the remainder of the evaluation will be completed by another provider, this initial triage assessment does not replace that evaluation, and the importance of remaining in the ED until their evaluation is complete.  Patient with 2 months of burning chest pain.  Suspect likely gastritis.  Patient is PERC negative.  Low suspicion for PE.  EKG reassuring without any ischemic changes.  Cardiac work-up is initiated and pending at this time.   Doristine Devoid, PA-C 10/05/22  1819

## 2022-10-05 NOTE — ED Notes (Signed)
Called x3 and no response. Pt cannot be found in lobby.
